# Patient Record
Sex: Male | Born: 1935 | Race: White | Hispanic: No | Smoking: Former smoker
Health system: Southern US, Community
[De-identification: ages and names within clinical notes are randomized; demographics above are authoritative.]

## PROBLEM LIST (undated history)

## (undated) DIAGNOSIS — C801 Malignant (primary) neoplasm, unspecified: Secondary | ICD-10-CM

## (undated) DIAGNOSIS — K219 Gastro-esophageal reflux disease without esophagitis: Secondary | ICD-10-CM

## (undated) DIAGNOSIS — I10 Essential (primary) hypertension: Secondary | ICD-10-CM

## (undated) DIAGNOSIS — I252 Old myocardial infarction: Secondary | ICD-10-CM

## (undated) DIAGNOSIS — E785 Hyperlipidemia, unspecified: Secondary | ICD-10-CM

## (undated) DIAGNOSIS — R7303 Prediabetes: Secondary | ICD-10-CM

## (undated) DIAGNOSIS — K633 Ulcer of intestine: Secondary | ICD-10-CM

## (undated) DIAGNOSIS — E559 Vitamin D deficiency, unspecified: Secondary | ICD-10-CM

## (undated) DIAGNOSIS — C61 Malignant neoplasm of prostate: Secondary | ICD-10-CM

## (undated) DIAGNOSIS — E079 Disorder of thyroid, unspecified: Secondary | ICD-10-CM

## (undated) HISTORY — DX: Malignant (primary) neoplasm, unspecified: C80.1

## (undated) HISTORY — DX: Gastro-esophageal reflux disease without esophagitis: K21.9

## (undated) HISTORY — DX: Essential (primary) hypertension: I10

## (undated) HISTORY — DX: Disorder of thyroid, unspecified: E07.9

## (undated) HISTORY — DX: Malignant neoplasm of prostate: C61

## (undated) HISTORY — PX: CATARACT EXTRACTION: SUR2

## (undated) HISTORY — DX: Vitamin D deficiency, unspecified: E55.9

## (undated) HISTORY — DX: Hyperlipidemia, unspecified: E78.5

## (undated) HISTORY — PX: PACEMAKER INSERTION: SHX728

## (undated) HISTORY — DX: Prediabetes: R73.03

## (undated) SURGERY — Surgical Case
Anesthesia: *Unknown

---

## 1997-07-31 ENCOUNTER — Ambulatory Visit (HOSPITAL_COMMUNITY): Admission: RE | Admit: 1997-07-31 | Discharge: 1997-07-31 | Payer: Self-pay | Admitting: Urology

## 1997-08-02 ENCOUNTER — Other Ambulatory Visit: Admission: RE | Admit: 1997-08-02 | Discharge: 1997-08-02 | Payer: Self-pay | Admitting: Internal Medicine

## 1998-11-21 ENCOUNTER — Emergency Department (HOSPITAL_COMMUNITY): Admission: EM | Admit: 1998-11-21 | Discharge: 1998-11-21 | Payer: Self-pay | Admitting: *Deleted

## 1999-01-21 ENCOUNTER — Encounter: Payer: Self-pay | Admitting: Internal Medicine

## 1999-01-21 ENCOUNTER — Ambulatory Visit (HOSPITAL_COMMUNITY): Admission: RE | Admit: 1999-01-21 | Discharge: 1999-01-21 | Payer: Self-pay | Admitting: Internal Medicine

## 2000-02-27 ENCOUNTER — Encounter: Payer: Self-pay | Admitting: Internal Medicine

## 2000-02-27 ENCOUNTER — Ambulatory Visit (HOSPITAL_COMMUNITY): Admission: RE | Admit: 2000-02-27 | Discharge: 2000-02-27 | Payer: Self-pay | Admitting: Internal Medicine

## 2001-03-04 ENCOUNTER — Ambulatory Visit (HOSPITAL_COMMUNITY): Admission: RE | Admit: 2001-03-04 | Discharge: 2001-03-04 | Payer: Self-pay | Admitting: Internal Medicine

## 2001-03-04 ENCOUNTER — Encounter: Payer: Self-pay | Admitting: Internal Medicine

## 2002-04-25 ENCOUNTER — Encounter: Payer: Self-pay | Admitting: Internal Medicine

## 2002-04-25 ENCOUNTER — Ambulatory Visit (HOSPITAL_COMMUNITY): Admission: RE | Admit: 2002-04-25 | Discharge: 2002-04-25 | Payer: Self-pay | Admitting: Internal Medicine

## 2003-05-21 ENCOUNTER — Inpatient Hospital Stay (HOSPITAL_COMMUNITY): Admission: EM | Admit: 2003-05-21 | Discharge: 2003-05-22 | Payer: Self-pay | Admitting: Emergency Medicine

## 2004-05-26 ENCOUNTER — Ambulatory Visit (HOSPITAL_COMMUNITY): Admission: RE | Admit: 2004-05-26 | Discharge: 2004-05-26 | Payer: Self-pay | Admitting: Internal Medicine

## 2005-04-01 ENCOUNTER — Ambulatory Visit: Payer: Self-pay | Admitting: Internal Medicine

## 2005-09-21 ENCOUNTER — Ambulatory Visit: Payer: Self-pay

## 2005-12-22 ENCOUNTER — Ambulatory Visit: Payer: Self-pay | Admitting: Internal Medicine

## 2006-06-22 ENCOUNTER — Ambulatory Visit (HOSPITAL_COMMUNITY): Admission: RE | Admit: 2006-06-22 | Discharge: 2006-06-22 | Payer: Self-pay | Admitting: Internal Medicine

## 2006-12-14 ENCOUNTER — Ambulatory Visit: Payer: Self-pay | Admitting: Internal Medicine

## 2007-06-13 ENCOUNTER — Ambulatory Visit: Payer: Self-pay | Admitting: Internal Medicine

## 2007-08-04 ENCOUNTER — Ambulatory Visit (HOSPITAL_COMMUNITY): Admission: RE | Admit: 2007-08-04 | Discharge: 2007-08-04 | Payer: Self-pay | Admitting: Internal Medicine

## 2007-08-17 ENCOUNTER — Ambulatory Visit: Payer: Self-pay | Admitting: Gastroenterology

## 2007-08-31 ENCOUNTER — Ambulatory Visit: Payer: Self-pay | Admitting: Gastroenterology

## 2007-08-31 ENCOUNTER — Encounter: Payer: Self-pay | Admitting: Gastroenterology

## 2007-09-02 ENCOUNTER — Encounter: Payer: Self-pay | Admitting: Gastroenterology

## 2007-12-27 ENCOUNTER — Ambulatory Visit: Payer: Self-pay | Admitting: Internal Medicine

## 2008-06-05 ENCOUNTER — Encounter: Payer: Self-pay | Admitting: Internal Medicine

## 2008-06-13 ENCOUNTER — Ambulatory Visit: Payer: Self-pay

## 2008-12-06 ENCOUNTER — Encounter: Payer: Self-pay | Admitting: Internal Medicine

## 2008-12-07 DIAGNOSIS — C61 Malignant neoplasm of prostate: Secondary | ICD-10-CM

## 2008-12-07 DIAGNOSIS — C329 Malignant neoplasm of larynx, unspecified: Secondary | ICD-10-CM

## 2008-12-07 DIAGNOSIS — I10 Essential (primary) hypertension: Secondary | ICD-10-CM | POA: Insufficient documentation

## 2008-12-07 DIAGNOSIS — E782 Mixed hyperlipidemia: Secondary | ICD-10-CM

## 2008-12-11 ENCOUNTER — Ambulatory Visit: Payer: Self-pay | Admitting: Internal Medicine

## 2008-12-11 DIAGNOSIS — Z950 Presence of cardiac pacemaker: Secondary | ICD-10-CM

## 2009-06-10 ENCOUNTER — Encounter: Payer: Self-pay | Admitting: Internal Medicine

## 2009-06-10 ENCOUNTER — Ambulatory Visit: Payer: Self-pay

## 2009-09-12 ENCOUNTER — Encounter (INDEPENDENT_AMBULATORY_CARE_PROVIDER_SITE_OTHER): Payer: Self-pay | Admitting: *Deleted

## 2009-10-17 ENCOUNTER — Ambulatory Visit: Payer: Self-pay | Admitting: Gastroenterology

## 2009-10-17 DIAGNOSIS — K219 Gastro-esophageal reflux disease without esophagitis: Secondary | ICD-10-CM

## 2009-12-11 ENCOUNTER — Ambulatory Visit: Payer: Self-pay | Admitting: Internal Medicine

## 2010-04-08 NOTE — Cardiovascular Report (Signed)
Summary: Office Visit   Office Visit   Imported By: Roderic Ovens 12/19/2009 15:19:01  _____________________________________________________________________  External Attachment:    Type:   Image     Comment:   External Document

## 2010-04-08 NOTE — Procedures (Signed)
Summary: [facility]   Current Medications (verified): 1)  Lansoprazole 30 Mg Cpdr (Lansoprazole) .... Take One Daily 2)  Synthroid 100 Mcg Tabs (Levothyroxine Sodium) .... Take One Daily 3)  Triamterene-Hctz 37.5-25 Mg Tabs (Triamterene-Hctz) .... Take One Half Daily 4)  Atenolol 50 Mg Tabs (Atenolol) .... Take One Daily 5)  Amitriptyline Hcl 50 Mg Tabs (Amitriptyline Hcl) .... Take One Daily 6)  Pravastatin Sodium 40 Mg Tabs (Pravastatin Sodium) .... Take One Daily 7)  Vit. D 8000 Units .... Daily 8)  Astelin 137 Mcg/spray Soln (Azelastine Hcl) .... Use As Needed  Allergies (verified): 1)  ! * Pcn  PPM Specifications Following MD:  Lewayne Bunting, MD     PPM Vendor:  St Jude     PPM Model Number:  [id number]     PPM Serial Number:  [id number] PPM DOI:  05/21/2003      Lead 1    Location: RA     DOI: 05/21/2003     Model #: 1488TC     Serial #: [id number]     Status: active Lead 2    Location: RV     DOI: 05/21/2003     Model #: 1488TC     Serial #: [id number]     Status: active  Magnet Response Rate:  BOL 98.6 ERI 86.3  Indications:  CHB Pacemaker dependent  Explantation Comments:  TTM's with Mednet  PPM Follow Up Battery Voltage:  2.76 V     Battery Est. Longevity:  3.75-4.50 yrs     Pacer Dependent:  Yes       PPM Device Measurements Atrium  Amplitude: 4.4 mV, Impedance: 488 ohms, Threshold: 0.75 V at 0.3 msec Right Ventricle  Amplitude: PACED mV, Impedance: 409 ohms, Threshold: 0.625 V at 0.5 msec  Episodes MS Episodes:  2     Percent Mode Switch:  <1%     Coumadin:  No Ventricular High Rate:  0     Atrial Pacing:  87%     Ventricular Pacing:  >99%  Parameters Mode:  DDDR     Lower Rate Limit:  70     Upper Rate Limit:  110 Paced AV Delay:  170     Sensed AV Delay:  150 Next Cardiology Appt Due:  06/09/2010 Tech Comments:  2 AMS EPISODES--LONGEST WAS 8 SECONDS.  NORMAL DEVICE FUNCTION.  NO CHANGES MADE. ROV IN 6 MTHS W/DEVICE CLINIC. Vella Kohler  December 11, 2009  6:07 PM

## 2010-04-08 NOTE — Assessment & Plan Note (Signed)
Summary: rectal bleeding--ch.   History of Present Illness Visit Type: Initial Consult Primary GI MD: Sheryn Bison MD FACP FAGA Primary Provider: Lucky Cowboy, MD Chief Complaint: Patient here for further evaluation of heme positive stool. He denies any visible rectal bleeding. He does have constipation. Patient denies any abdominal pain or other GI symptoms associated with the constipation. History of Present Illness:   77 year old Caucasian male with a history of chronic complete heart block requiring pacemaker insertion who has had previous radiation implants and external beam radiation for prostate cancer with resultant chronic radiation proctitis. He had colonoscopy 2 years ago and with removal of benign polyp and the detection of radiation proctitis.  He occasionally has some itching and discomfort around his rectum with a hard stool and apparently had a Hemoccult positive stool exam by his primary care physician. He denies abdominal pain, upper gastrointestinal or hepatobiliary complaints. He has a very involved past history and I spent a great deal of time reviewing his records and his multiple medical and surgical problems with the patient.  He takes daily Prevacid 30 mg for acid reflux. 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 His appetite is good and his weight is stable. He does have mild chronic functional constipation but denies laxative use. Recent lab data in July showed normal CBC with hemoglobin 15.4.   GI Review of Systems      Denies abdominal pain, acid reflux, belching, bloating, chest pain, dysphagia with liquids, dysphagia with solids, heartburn, loss of appetite, nausea, vomiting, vomiting blood, weight loss, and  weight gain.      Reports constipation and  heme positive stool.     Denies  anal fissure, black tarry stools, change in bowel habit, diarrhea, diverticulosis, fecal incontinence, hemorrhoids, irritable bowel syndrome, jaundice, light color stool, liver problems, rectal bleeding, and  rectal pain. Preventive Screening-Counseling & Management  Alcohol-Tobacco     Smoking Status: quit  Caffeine-Diet-Exercise     Does Patient Exercise: yes      Drug Use:  no.      Current Medications (verified): 1)  Lansoprazole 30 Mg Cpdr (Lansoprazole) .... Take One Daily 2)  Synthroid 100 Mcg Tabs (Levothyroxine Sodium) .... Take One Daily 3)  Triamterene-Hctz 37.5-25 Mg Tabs (Triamterene-Hctz) .... Take One Half Daily 4)  Atenolol 50 Mg Tabs (Atenolol) .... Take One Daily 5)  Amitriptyline Hcl 50 Mg Tabs (Amitriptyline Hcl) .... Take One Daily 6)  Pravastatin Sodium 40 Mg Tabs (Pravastatin Sodium) .... Take One Daily 7)  Vit. D 8000 Units .... Daily 8)  Astelin 137 Mcg/spray Soln (Azelastine Hcl) .... Use As Needed  Allergies (verified): 1)  ! * Pcn  Past History:  Past medical, surgical, family and social histories (including risk factors) reviewed for relevance to current acute and chronic problems.  Past Medical History:  PROSTATE CANCER (ICD-185) THROAT CANCER (ICD-149.0) HYPERTENSION (ICD-401.9) Seasonal Allergies Hypothyroidism Hyperlipidemia GERD  Past Surgical History: permanent pacemaker dual chamber [employer]. Jude 06/14/03 Cataract Extraction-bilateral Lens Implants-Bilateral  Family History: Reviewed history from 12/07/2008 and no changes required. Father: deeased at 33 yrs with leukemia Mother: heart disease at 28 yrs Siblings: 2 brothers 30 yrs died od lung cancer and one killed in WWII No FH of Colon Cancer:  Social History: Reviewed history from 12/07/2008 and no changes required. Married 3 x's Alcohol Use -  no Retired  Patient is a former smoker. -stopped in 1984  Daily Caffeine Use-1 cup coffee  Illicit Drug Use - no Patient gets regular  exercise. Smoking Status:  quit Drug Use:  no Does Patient Exercise:  yes  Review of Systems       The patient complains of allergy/sinus, change in vision, hearing problems, and urination - excessive.  The patient denies anemia, anxiety-new, arthritis/joint pain, back pain, blood in urine, breast changes/lumps, confusion, cough, coughing up blood, depression-new, fainting, fatigue, fever, headaches-new, heart murmur, heart rhythm changes, itching, menstrual pain, muscle pains/cramps, night sweats, nosebleeds, pregnancy symptoms, shortness of breath, skin rash, sleeping problems, sore throat, swelling of feet/legs, swollen lymph glands, thirst - excessive , urination - excessive , urination changes/pain, urine leakage, vision changes, and voice change.    Vital Signs:  Patient profile:   77 year old male Height:      72 inches Weight:      214.25 pounds BMI:     29.16 BSA:     2.20 Pulse rate:   76 / minute Pulse rhythm:   regular BP sitting:   128 / 80  (right arm)  Vitals Entered By: Lamona Curl CMA Duncan Dull) (October 17, 2009 9:46 AM)  Physical Exam  General:  Well developed, well nourished, no acute distress.healthy appearing.   Head:  Normocephalic and atraumatic. Eyes:  PERRLA, no icterus.exam deferred to patient's ophthalmologist.   Neck:  deformity of his neck area with scars in the right neck contraction of the skin and soft tissue. Lungs:  Clear throughout to auscultation.decreased BS on L and decreased BS on R.   Heart:  Regular rate and rhythm; no murmurs, rubs,  or bruits.Pacemaker in left upper chest area. Abdomen:  Soft, nontender and nondistended. No masses, hepatosplenomegaly or hernias noted. Normal bowel sounds. Rectal:  2 obvious nonbleeding next sternal hemorrhoids without fissures or fistulae. Rectal exam shows good sphincter tan without masses or tenderness. Stool is soft and normal color and guaiac-negative. Prostate:  .normal size prostate.   Msk:   Symmetrical with no gross deformities. Normal posture. Extremities:  No clubbing, cyanosis, edema or deformities noted. Neurologic:  Alert and  oriented x4;  grossly normal neurologically. Cervical Nodes:  No significant cervical adenopathy. Psych:  Alert and cooperative. Normal mood and affect.   Impression & Recommendations:  Problem # 1:  CONSTIPATION (ICD-564.00) Assessment Deteriorated High fiber diet with daily Benefiber as tolerated and also we will let him try [employer]' Colon Health 5 or and probiotic tablets twice a day. I've asked him to increase his intake of p.o. liquids additionally.  Problem # 2:  RECTAL BLEEDING (ICD-569.3) Assessment: Improved Probable combination of external hemorrhoids and radiation proctitis. Since he is asymptomatic and his hemoglobin is normal I see no need to repeat his colonoscopy at this time.  Problem # 3:  GERD (ICD-530.81) Assessment: Improved Continue Prevacid 30 mg a day with standard anti-reflux maneuvers. His previous laryngeal carcinoma may have been related to his chronic GERD which has been under good management and care for the last several years. I see no benefit from endoscopy this time because of his age and otherwise lack of upper gastrointestinal difficulties.  Problem # 4:  PERSONAL HISTORY MALIGNANT NEOPLASM PROSTATE (ICD-V10.46) Assessment: Unchanged Previous radiation treatment x2 for prostate carcinoma. He is followed by urology a regular basis.  Patient Instructions: 1)  Please pick up your medications at your pharmacy.  2)  Please pick up PHILLIPS COLON HEALTH, a  probiotic, while at your pharmacy.  You will need to take two times a day as directed below. 3)  Please schedule a follow-up appointment as needed.  4)  The medication list was reviewed and reconciled.  All changed / newly prescribed medications were explained.  A complete medication list was provided to the patient / caregiver. 5)  Copy sent to : Dr. Lucky Cowboy. 6)  Please continue current medications.  Prescriptions: ANAMANTLE HC 3-0.5 % CREA (LIDOCAINE-HYDROCORTISONE ACE) apply as directed to affected area  #30 gram x 3   Entered by:   Francee Piccolo CMA (AAMA)   Authorized by:   Mardella Layman MD [facility]   Signed by:   Francee Piccolo CMA (AAMA) on 10/17/2009   Method used:   Electronically to        [facility]  [address] [id number] (retail)       [address]       [place], [place]  [address]       Ph: [phone] or [phone]       Fax: [phone]   RxID:   [id number]

## 2010-04-08 NOTE — Procedures (Signed)
Summary: st. jude/saf   Current Medications (verified): 1)  Lansoprazole 30 Mg Cpdr (Lansoprazole) .... Take One Daily 2)  Synthroid 100 Mcg Tabs (Levothyroxine Sodium) .... Take One Daily 3)  Triamterene-Hctz 37.5-25 Mg Tabs (Triamterene-Hctz) .... Take One Half Daily 4)  Atenolol 50 Mg Tabs (Atenolol) .... Take One Daily 5)  Amitriptyline Hcl 50 Mg Tabs (Amitriptyline Hcl) .... Take One Daily 6)  Pravastatin Sodium 40 Mg Tabs (Pravastatin Sodium) .... Take One Daily 7)  Vit. D 8000 Units .... Daily 8)  Astelin 137 Mcg/spray Soln (Azelastine Hcl) .... Use As Needed  Allergies (verified): 1)  ! * Pcn   PPM Specifications Following MD:  Lewayne Bunting, MD     PPM Vendor:  St Jude     PPM Model Number:  [id number]     PPM Serial Number:  [id number] PPM DOI:  05/21/2003      Lead 1    Location: RA     DOI: 05/21/2003     Model #: 1488TC     Serial #: [id number]     Status: active Lead 2    Location: RV     DOI: 05/21/2003     Model #: 1488TC     Serial #: [id number]     Status: active  Magnet Response Rate:  BOL 98.6 ERI 86.3  Indications:  CHB Pacemaker dependent  Explantation Comments:  TTM's with Mednet  PPM Follow Up Remote Check?  No Battery Voltage:  2.76 V     Battery Est. Longevity:  4 years     Pacer Dependent:  Yes       PPM Device Measurements Atrium  Amplitude: 3.0 mV, Impedance: 521 ohms, Threshold: 1.0 V at 0.3 msec Right Ventricle  Impedance: 418 ohms, Threshold: 0.625 V at 0.5 msec  Episodes MS Episodes:  5     Percent Mode Switch:  <1%     Coumadin:  No Atrial Pacing:  88%     Ventricular Pacing:  69%  Parameters Mode:  DDDR     Lower Rate Limit:  70     Upper Rate Limit:  110 Paced AV Delay:  170     Sensed AV Delay:  150 Next Cardiology Appt Due:  12/07/2009 Tech Comments:  No parameter changes.  5 mode switch episodes noted the longest 5:52 hours.  Mr. Fitz is not on coumadin. He no longer does TTM's.  We will see him back in 6 months time with Dr. Ladona Ridgel.   Altha Harm, LPN  June 10, 6960 9:13 AM  MD Comments:  Agree with above.

## 2010-04-08 NOTE — Cardiovascular Report (Signed)
Summary: Office Visit   Office Visit   Imported By: Roderic Ovens 06/14/2009 13:57:33  _____________________________________________________________________  External Attachment:    Type:   Image     Comment:   External Document

## 2010-04-08 NOTE — Assessment & Plan Note (Signed)
Summary: pacer check/st. jude   Visit Type:  Follow-up/pacer check Referring Provider:  Lucky Cowboy, MD Primary Provider:  Lucky Cowboy, MD   History of Present Illness: Scott [name] returns today for followup of his PPM.  He is a pleasant elderly man with a h/o syncope and symptomatic bradycardia, s/p PPM, also with a h/o HTN and dyslipidemia.  He denies c/p or sob or peripheral edema.  He has had cataract surgery.  His vision is good.  No syncope or palpitations.   Current Medications (verified): 1)  Lansoprazole 30 Mg Cpdr (Lansoprazole) .... Take One Daily 2)  Synthroid 100 Mcg Tabs (Levothyroxine Sodium) .... Take One Daily 3)  Triamterene-Hctz 37.5-25 Mg Tabs (Triamterene-Hctz) .... Take One Half Daily 4)  Atenolol 50 Mg Tabs (Atenolol) .... Take One Daily 5)  Amitriptyline Hcl 50 Mg Tabs (Amitriptyline Hcl) .... Take One Daily 6)  Pravastatin Sodium 40 Mg Tabs (Pravastatin Sodium) .... Take One Daily 7)  Vit. D 8000 Units .... Daily 8)  Astelin 137 Mcg/spray Soln (Azelastine Hcl) .... Use As Needed  Allergies (verified): 1)  ! * Pcn  Past History:  Past Medical History: Last updated: 10/17/2009  PROSTATE CANCER (ICD-185) THROAT CANCER (ICD-149.0) HYPERTENSION (ICD-401.9) Seasonal Allergies Hypothyroidism Hyperlipidemia GERD  Past Surgical History: Last updated: 10/17/2009 permanent pacemaker dual chamber St. Jude 06/14/03 Cataract Extraction-bilateral Lens Implants-Bilateral  Review of Systems  The patient denies chest pain, syncope, dyspnea on exertion, and peripheral edema.    Vital Signs:  Patient profile:   77 year old male Height:      72 inches Weight:      214 pounds BMI:     29.13 Pulse rate:   74 / minute BP sitting:   117 / 73  (left arm) Cuff size:   regular  Vitals Entered By: Scott Kanaris, CNA (December 11, 2009 11:47 AM)  Physical Exam  General:  Well developed, well nourished, no acute distress.healthy appearing.   Head:   Normocephalic and atraumatic. Eyes:  PERRLA, no icterus.exam deferred to patient's ophthalmologist.   Mouth:  Poor dentition. Neck:  deformity of his neck area with scars in the right neck contraction of the skin and soft tissue. Chest Wall:  Well healed PPM incision. Lungs:  Clear throughout to auscultation.decreased BS on L and decreased BS on R.   Heart:  Regular rate and rhythm; no murmurs, rubs,  or bruits.Pacemaker in left upper chest area. Abdomen:  Soft, nontender and nondistended. No masses, hepatosplenomegaly or hernias noted. Normal bowel sounds. Msk:  Symmetrical with no gross deformities. Normal posture. Pulses:  pulses normal in all 4 extremities Extremities:  No clubbing, cyanosis, edema or deformities noted. Neurologic:  Alert and  oriented x4;  grossly normal neurologically.   PPM Specifications Following MD:  Lewayne Bunting, MD     PPM Vendor:  St Jude     PPM Model Number:  [id number]     PPM Serial Number:  [id number] PPM DOI:  05/21/2003      Lead 1    Location: RA     DOI: 05/21/2003     Model #: 1488TC     Serial #: [id number]     Status: active Lead 2    Location: RV     DOI: 05/21/2003     Model #: 1488TC     Serial #: [id number]     Status: active  Magnet Response Rate:  BOL 98.6 ERI 86.3  Indications:  CHB Pacemaker dependent  Explantation Comments:  TTM's with Mednet  PPM Follow Up Pacer Dependent:  Yes      Episodes Coumadin:  No  Parameters Mode:  DDDR     Lower Rate Limit:  70     Upper Rate Limit:  110 Paced AV Delay:  170     Sensed AV Delay:  150 MD Comments:  Normal device function.   Impression & Recommendations:  Problem # 1:  CARDIAC PACEMAKER IN SITU (ICD-V45.01) His device is working normally and he is maintaining NSR.  will recheck in several months.  Problem # 2:  HYPERTENSION (ICD-401.9) His blood pressure is well controlled.  Continue meds as below and maintian a low sodium diet. His updated medication list for this problem includes:     Triamterene-hctz 37.5-25 Mg Tabs (Triamterene-hctz) .Marland Kitchen... Take one half daily    Atenolol 50 Mg Tabs (Atenolol) .Marland Kitchen... Take one daily  Problem # 3:  HYPERLIPIDEMIA, MILD (ICD-272.4) A low fat diet and continued meds as below are recommended. His updated medication list for this problem includes:    Pravastatin Sodium 40 Mg Tabs (Pravastatin sodium) .Marland Kitchen... Take one daily  Patient Instructions: 1)  Your physician recommends that you continue on your current medications as directed. Please refer to the Current Medication list given to you today. 2)  Your physician wants you to follow-up in: 1 year  You will receive a reminder letter in the mail two months in advance. If you don't receive a letter, please call our office to schedule the follow-up appointment.

## 2010-04-08 NOTE — Letter (Signed)
Summary: New Patient letter  [facility]  [address] [place], [place] [address]   Phone: [phone]  Fax: [phone]       09/12/2009 MRN: [id number]  Scott [name] [address] [place], [place]  [address]  Dear Scott [name],  Welcome to the Gastroenterology Division at [facility].    You are scheduled to see Dr.  Jarold Motto on 10-17-09 at 10:00a.m. on the 3rd floor at [facility], [address]  We ask that you try to arrive at our office 15 minutes prior to your appointment time to allow for check-in.  We would like you to complete the enclosed self-administered evaluation form prior to your visit and bring it with you on the day of your appointment.  We will review it with you.  Also, please bring a complete list of all your medications or, if you prefer, bring the medication bottles and we will list them.  Please bring your insurance card so that we may make a copy of it.  If your insurance requires a referral to see a specialist, please bring your referral form from your primary care physician.  Co-payments are due at the time of your visit and may be paid by cash, check or credit card.     Your office visit will consist of a consult with your physician (includes a physical exam), any laboratory testing he/she may order, scheduling of any necessary diagnostic testing (e.g. x-ray, ultrasound, CT-scan), and scheduling of a procedure (e.g. Endoscopy, Colonoscopy) if required.  Please allow enough time on your schedule to allow for any/all of these possibilities.    If you cannot keep your appointment, please call [phone] to cancel or reschedule prior to your appointment date.  This allows [place] the opportunity to schedule an appointment for another patient in need of care.  If you do not cancel or reschedule by 5 p.m. the business day prior to your appointment date, you will be charged a $50.00 late cancellation/no-show fee.    Thank you for choosing  [facility] for your medical needs.  We appreciate the opportunity to care for you.  Please visit [place] at our website  to learn more about our practice.                     Sincerely,                                                             The Gastroenterology Division

## 2010-04-09 ENCOUNTER — Encounter: Payer: Self-pay | Admitting: Internal Medicine

## 2010-04-09 ENCOUNTER — Encounter (INDEPENDENT_AMBULATORY_CARE_PROVIDER_SITE_OTHER): Payer: Medicare Other | Admitting: Internal Medicine

## 2010-04-09 ENCOUNTER — Ambulatory Visit: Admit: 2010-04-09 | Payer: Self-pay | Admitting: Internal Medicine

## 2010-04-09 DIAGNOSIS — I10 Essential (primary) hypertension: Secondary | ICD-10-CM

## 2010-04-09 DIAGNOSIS — I442 Atrioventricular block, complete: Secondary | ICD-10-CM

## 2010-04-16 NOTE — Assessment & Plan Note (Signed)
Summary: F1Y/FM PER PT CALL-MJ/MJ   Vital Signs:  Patient profile:   77 year old male Height:      72 inches Weight:      220 pounds BMI:     29.95 Pulse rate:   72 / minute BP sitting:   130 / 80  (left arm)  Vitals Entered By: Laurance Flatten CMA (April 09, 2010 10:37 AM)  Referring Provider:  Lucky Cowboy, MD Primary Provider:  Lucky Cowboy, MD   History of Present Illness: Mr. Morini returns today for followup of his PPM.  He is a pleasant elderly man with a h/o syncope and symptomatic bradycardia, s/p PPM, also with a h/o HTN and dyslipidemia.  He denies c/p or sob or peripheral edema.  He has had cataract surgery.  His vision is good.  No syncope or palpitations. His only complaint today is that if he gets up quickly he will get dizzy. No frank synocpe.   Current Medications (verified): 1)  Lansoprazole 30 Mg Cpdr (Lansoprazole) .... Take One Daily 2)  Synthroid 100 Mcg Tabs (Levothyroxine Sodium) .... Take One Daily 3)  Triamterene-Hctz 37.5-25 Mg Tabs (Triamterene-Hctz) .... Take One Half Daily 4)  Atenolol 50 Mg Tabs (Atenolol) .... Take One Daily 5)  Amitriptyline Hcl 50 Mg Tabs (Amitriptyline Hcl) .... Take One Daily 6)  Pravastatin Sodium 40 Mg Tabs (Pravastatin Sodium) .... Take One Daily 7)  Vit. D 8000 Units .... Daily 8)  Astelin 137 Mcg/spray Soln (Azelastine Hcl) .... Use As Needed  Allergies: 1)  ! * Pcn  Past History:  Past Medical History: Last updated: 10/17/2009  PROSTATE CANCER (ICD-185) THROAT CANCER (ICD-149.0) HYPERTENSION (ICD-401.9) Seasonal Allergies Hypothyroidism Hyperlipidemia GERD  Past Surgical History: Last updated: 10/17/2009 permanent pacemaker dual chamber St. Jude 06/14/03 Cataract Extraction-bilateral Lens Implants-Bilateral  Family History: Last updated: 10/17/2009 Father: deeased at 41 yrs with leukemia Mother: heart disease at 38 yrs Siblings: 2 brothers 56 yrs died od lung cancer and one killed in WWII No FH  of Colon Cancer:  Social History: Last updated: 10/17/2009 Married 3 x's Alcohol Use - no Retired  Patient is a former smoker. -stopped in 1984  Daily Caffeine Use-1 cup coffee  Illicit Drug Use - no Patient gets regular exercise.  Review of Systems  The patient denies chest pain, syncope, dyspnea on exertion, and peripheral edema.    Physical Exam  General:  Well developed, well nourished, no acute distress.healthy appearing.   Head:  Normocephalic and atraumatic. Eyes:  PERRLA, no icterus.exam deferred to patient's ophthalmologist.   Mouth:  Poor dentition. Neck:  deformity of his neck area with scars in the right neck contraction of the skin and soft tissue. Chest Wall:  Well healed PPM incision. Lungs:  Clear throughout to auscultation.decreased BS on L and decreased BS on R.   Heart:  Regular rate and rhythm; no murmurs, rubs,  or bruits.Pacemaker in left upper chest area. Abdomen:  Soft, nontender and nondistended. No masses, hepatosplenomegaly or hernias noted. Normal bowel sounds. Msk:  Symmetrical with no gross deformities. Normal posture. Pulses:  pulses normal in all 4 extremities Extremities:  No clubbing, cyanosis, edema or deformities noted. Neurologic:  Alert and  oriented x4;  grossly normal neurologically.   Impression & Recommendations:  Problem # 1:  CARDIAC PACEMAKER IN SITU (ICD-V45.01) His device is working normally. Will recheck in several months.  Problem # 2:  HYPERTENSION (ICD-401.9) His blood pressure is well controlled. A low sodium diet is requested. His  updated medication list for this problem includes:    Triamterene-hctz 37.5-25 Mg Tabs (Triamterene-hctz) .Marland Kitchen... Take one half daily    Atenolol 50 Mg Tabs (Atenolol) .Marland Kitchen... Take one daily  Patient Instructions: 1)  Your physician recommends that you schedule a follow-up appointment in: 6 months with device clinic and 12 months with Dr Ladona Ridgel      [facility] Specifications Following MD:  Lewayne Bunting, MD     PPM Vendor:  [facility] Jude     PPM Model Number:  [id number]     PPM Serial Number:  [id number] PPM DOI:  05/21/2003      Lead 1    Location: RA     DOI: 05/21/2003     Model #: [id number]     Serial #: [id number]     Status: active Lead 2    Location: RV     DOI: 05/21/2003     Model #: [id number]     Serial #: [id number]     Status: active  Magnet Response Rate:  BOL 98.6 ERI 86.3  Indications:  CHB Pacemaker dependent  Explantation Comments:  TTM's with [employer]  PPM Follow Up Pacer Dependent:  Yes       PPM Device Measurements Atrium  Amplitude: 3.5 mV, Impedance: 461 ohms, Threshold: 0.75 V at 0.3 msec Right Ventricle  Amplitude: PACED mV, Impedance: 422 ohms,   Episodes MS Episodes:  5     Percent Mode Switch:  <1%     Coumadin:  No Ventricular High Rate:  0     Atrial Pacing:  91%     Ventricular Pacing:  >99%  Parameters Mode:  DDDR     Lower Rate Limit:  70     Upper Rate Limit:  110 Paced AV Delay:  170     Sensed AV Delay:  150 Next Cardiology Appt Due:  10/08/2010 Tech Comments:  5 AMS EPISODES--LONGEST WAS 8 SECONDS.  NORMAL DEVICE FUNCTION. HISTOGRAMS A LITTLE BUT PT HAS NO SOB OR FATIGUE.  NO CHANGES MADE. ROV IN 6 MTHS W/DEVICE CLINIC. Vella Kohler  April 09, 2010 10:52 AM MD Comments:  Agree with above.

## 2010-04-24 NOTE — Cardiovascular Report (Signed)
Summary: Office Visit   Office Visit   Imported By: Roderic Ovens 04/17/2010 16:19:34  _____________________________________________________________________  External Attachment:    Type:   Image     Comment:   External Document

## 2010-07-22 NOTE — Assessment & Plan Note (Signed)
[facility]                         ELECTROPHYSIOLOGY OFFICE NOTE   Scott [name], Scott [name]                     MRN:          [id number]  DATE:12/27/2007                            DOB:          [birth year]    Scott [name] returns today for followup.  He is a very pleasant elderly  male with a history of complete heart block and bradycardia.  He also  has a history of hypertension, who returns today for followup.  The  patient has done well since we last saw him one year ago.  He had no  specific complaints today.   MEDICATIONS INCLUDE:  1. Amitriptyline 50 mg q.p.m.  2. Maxzide 37.5/25 daily.  3. Synthroid 100 mcg daily.  4. Prevacid 30 mg daily.  5. Atenolol 50 mg half-tablet daily.  6. Pravachol 40 mg daily.   PHYSICAL EXAMINATION:  GENERAL:  He is a pleasant well-appearing elderly  man in no acute distress.  VITAL SIGNS:  Blood pressure was 130/73, the pulse 70 and regular, the  respirations were 18, and the weight was 215 pounds.  NECK:  Reveals no jugular venous distention.  LUNGS:  Clear bilaterally to auscultation.  No wheezes, rales, or  rhonchi are present.  No increased work of breathing.  CARDIOVASCULAR:  Reveals a regular rate and rhythm.  Normal S1 and S2.  There are no murmurs, rubs, or gallops.  EXTREMITIES:  Demonstrated no  peripheral edema.  ABDOMEN:  Soft and nontender.  There is no organomegaly.   Interrogation of his device demonstrates a Corporate treasurer.  P-waves  were 4.  The R-waves were 10.  The impedance was 454 in the A and 409 in  the RV.  The threshold was 1 at 0.5 in the atrium and 0.625 at 0.5 in  the RV.  Battery voltage was 2.76 volts.  He was 87% A-paced and 84% V-  paced.   IMPRESSION:  1. Complete heart block.  2. Hypertension.  3. Status post pacemaker insertion.   DISCUSSION:  Scott [name] is stable.  His pacemaker is working normally.  I plan to see the patient back in the office for pacemaker  followup in 1  year.    Scott [name]. Scott Ridgel, MD  Electronically Signed   GWT/MedQ  DD: 12/27/2007  DT: 12/28/2007  Job #: [id number]

## 2010-07-22 NOTE — Assessment & Plan Note (Signed)
[facility]                         ELECTROPHYSIOLOGY OFFICE NOTE   Scott [name]                     MRN:          [id number]  DATE:12/14/2006                            DOB:          1925/05/18    Scott [name] returns today for followup.  He is a very pleasant elderly  male with complete heart block and hypertension who is status post  pacemaker implantation back in 2005.  He returns today for followup.  He  notes that in the interim he has been very active and exercises on a  regular basis.  He denies chest pain, he denies shortness of breath.   PHYSICAL EXAMINATION:  He is a pleasant, well-appearing, elderly man in  no distress.  The blood pressure was 140/75, the pulse was 70 and  regular, the respirations were 16, the weight was 215 pounds.  NECK:  No jugular venous distention.  LUNGS:  Clear bilaterally to auscultation.  No wheezes, rales or rhonchi  were present.  CARDIOVASCULAR EXAM:  A regular rate and rhythm with a normal S1 S2.  EXTREMITIES:  No edema.   MEDICATIONS:  1. Amitriptyline.  2. Maxzide 37.5/25.  3. Synthroid 100 mcg daily.  4. Prevacid 30 a day.  5. Atenolol 50 mg 1/2 tablet daily.  6. Lipitor 4 mg, 1/2 tablet daily.   Interrogation of his pacemaker demonstrates a St. Jude Identity dual  chamber device with P waves that were 3 and R waves that were 8, the  impedance 456 in the atrium, 413 in the ventricle, the threshold 0.75 at  0.5 in the atrium, 0.625 at 0.5 in the ventricle, the battery voltage  was 2.76 volts.   IMPRESSION:  1. Complete heart block.  2. Hypertension.  3. Status post pacemaker insertion.   DISCUSSION:  Overall, Scott [name] is stable and his pacemaker was  working normally.  Will plan to see him back in the office in 1 year.     Doylene Canning. Ladona Ridgel, MD  Electronically Signed    GWT/MedQ  DD: 12/14/2006  DT: 12/14/2006  Job #: [id number]

## 2010-07-25 NOTE — Assessment & Plan Note (Signed)
[facility]                           ELECTROPHYSIOLOGY OFFICE NOTE   [name], NO                     MRN:          [id number]  DATE:12/22/2005                            DOB:          05-02-1925    Scott [name] returns today for followup.  He is a very pleasant elderly male  with hypertension and complete heart block, syncope, who is status post  pacemaker insertion.  He returns today for followup.  He denies chest pain  or shortness of breath and overall feels well, having no syncopal episodes  since his pacemaker was implanted.  This was carried out on March, 2005.   PHYSICAL EXAMINATION:  GENERAL:  On exam today, he is a pleasant, well-  appearing man in no acute distress.  VITAL SIGNS:  His blood pressure today was 126/76, pulse 71 and regular,  respirations were 18.  Weight was 225 pounds.  NECK:  No jugular venous distention.  LUNGS:  Clear bilaterally to auscultation.  CARDIOVASCULAR:  Regular rate and rhythm with normal S1 and S2.  EXTREMITIES:  No edema.   Interrogation of his pacemaker demonstrates a St. Jude Identify with P waves  of 3 and R waves which are not present secondary to his underlying pacemaker  dependence.  The impedence was 462 on the atrium and 430 in the ventricle.  The threshold was 0.75 to 0.5 in the atrium, 0.625 at 0.5 in the ventricle.  Battery voltage was 2.75 volts.   IMPRESSION:  1. Complete heart block.  2. Hypertension.  3. Status post pacemaker insertion.   DISCUSSION:  Overall, Scott [name] is stable, and his pacemaker is working  satisfactorily.  Will plan to see him back in the office in a year.            ______________________________  Doylene Canning. Ladona Ridgel, MD     GWT/MedQ  DD:  12/22/2005  DT:  12/23/2005  Job #:  [id number]   cc:   Lucky Cowboy, M.D.

## 2010-07-25 NOTE — Op Note (Signed)
NAME:  Scott [name], Scott [name]                        ACCOUNT NO.:  [id number]   MEDICAL RECORD NO.:  [id number]                   PATIENT TYPE:  INP   LOCATION:  1824                                 FACILITY:  [facility]   PHYSICIAN:  Doylene Canning. Ladona Ridgel, M.D.               DATE OF BIRTH:  01/06/1926   DATE OF PROCEDURE:  05/21/2003  DATE OF DISCHARGE:                                 OPERATIVE REPORT   PROCEDURE PERFORMED:  Insertion of a permanent dual-chamber pacemaker.   INDICATIONS:  Symptomatic complete heart block with syncope.   INTRODUCTION:  The patient is a very pleasant elderly man with a history of  multiple medical problems, including head and neck cancer status post  dissection.  He has a history of left lung abscess treated by Dr. Micah Noel  in the past.  He has a history of prostate cancer.  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  The patient  was in his usual health until approximately three days ago, when he had  episodes of dizziness as well as frank syncope.  This would typically occur  while standing.  He proceeded to his primary physician's office Lucky Cowboy, M.D.) and was found to be in complete heart block with a  ventricular rate between 26 and 30 beats per minute.  At rest the patient  was asymptomatic but with any exertion, he became quite symptomatic and had  syncope.  He is now admitted for permanent pacemaker insertion.  The patient  denies chest pain or shortness of breath.   DESCRIPTION OF PROCEDURE:  After informed consent was obtained, the patient  was taken to the diagnostic EP lab in the fasted state.  After the usual  preparation and draping, a total of 30 mL of lidocaine was infiltrated into  the left infraclavicular region.  A 7 cm incision was carried out over this  region and electrocautery utilized to dissect down to the fascial plane.  The left subclavian vein was punctured x2 and the St. Jude bipolar  pacing  lead, serial number [id number], serial number [id number], bipolar pacing lead  was advanced into the right ventricle and the St. Jude model 1488-T 52-cm  active-fixation pacing lead, serial number [id number], was advanced to the  right atrium.  Mapping was carried out in the right ventricle.  During  mapping there were multiple PVCs, which was followed by a period of asystole  lasting approximately 10 seconds.  This occurred on one additional occasion.  Finally the RV lead was placed out near the RV apex on the RV septum.  It  should be noted that the lead was actively fixed and there were stable R-  waves and pacing thresholds.  The patient had significant tricuspid  regurgitation when he was in complete heart block.  At the final site the R-  waves were stable at 6 V and the  pacing threshold was 0.5 V at 0.5 msec with  a pacing impedance of 625 Ohms, which was also quite stable.  Ten-volt  pacing in this location did not stimulate the diaphragm.  With the  ventricular lead in satisfactory position, attention was then turned to the  atrial lead.  It was placed on the anterolateral wall of the right atrium,  where P-waves measured 3-4 mV and the atrial threshold was 0.9 V at 0.5 msec  with a pacing impedance of 581 Ohms, again once the lead was actively fixed.  Ten-volt pacing did not stimulate the diaphragm.  With both atrial and  ventricular leads in satisfactory position, they were secured to the  subpectoralis fascia with a figure-of-eight silk suture.  The sewing sleeve  was also secured with a silk suture.  At this point electrocautery was  utilized to make a subcutaneous pocket.  Kanamycin was utilized to irrigate  the pocket.  Electrocautery was utilized to assure hemostasis.  The St. Jude  model [id number] dual-chamber pacemaker, serial number [id number], was connected to  the atrial and ventricular pacing leads and placed back in the subcutaneous  pocket.  The generator was secured with  silk suture.  Additional kanamycin  was utilized to irrigate the pocket and the incision was closed with a layer  of 2-0 Vicryl, followed by a layer of 3-0 Vicryl, followed by a layer of 4-0  Vicryl.  Benzoin was painted on the skin and Steri-Strips were applied, and  a pressure dressing placed and the patient returned to his room in  satisfactory condition.   COMPLICATIONS:  There were no immediate procedural complications.   RESULTS:  This demonstrates successful implantation of a St. Jude dual-  chamber pacemaker in a patient with symptomatic completely heart block and a  ventricular escape in the 25-30 beat per minute range.                                               Doylene Canning. Ladona Ridgel, M.D.    GWT/MEDQ  D:  05/21/2003  T:  05/22/2003  Job:  [id number]   cc:   Lucky Cowboy, M.D.  [address]  [place], [place] [address]  Fax: [phone]   Kathrine Cords, R.N. [facility]

## 2010-07-25 NOTE — Discharge Summary (Signed)
NAME:  SYDNEY, AZURE                        ACCOUNT NO.:  [id number]   MEDICAL RECORD NO.:  [id number]                   PATIENT TYPE:  INP   LOCATION:  2927                                 FACILITY:  [facility]   PHYSICIAN:  Doylene Canning. Ladona Ridgel, M.D.               DATE OF BIRTH:  [birth year]   DATE OF ADMISSION:  05/21/2003  DATE OF DISCHARGE:  05/22/2003                                 DISCHARGE SUMMARY   PRIMARY DIAGNOSIS:  Syncope.   SECONDARY DIAGNOSES:  1. Hypertension.  2. Throat cancer.  3. Prostate cancer.   HPI:  This is a 77 year old gentleman with a past medical history of  hypertension, dyslipidemia and throat cancer, prostate cancer who on routine  exam was found to have a complete heart block.  He states for the past week  or so when he gets up fast he has loss of consciousness.  EKG shows a  complete heart block.  The patient was admitted and underwent placement of a  permanent pacemaker dual chamber St. Jude, tolerated the procedure well.  Had no immediate postop complications.  He was discharged the following day  in stable condition.   The patient was discharged on the following medications:  1. Synthroid 100 mcg daily.  2. Maxzide 37.5/25 daily.  3. Elavil 50 nightly.  4. Prevacid 30 daily.  5. Atenolol 25 daily.  6. Lipitor 40 every other day.  7. He is to take Tylenol one to two tabs every four to six hours as needed.   Activity and wound care were per pacemaker discharge sheet.  Low fat, low  salt, low cholesterol diet.  No driving for 10 days. He is to be seen at the  pacemaker clinic at [facility] March 30 at 9:45 and Dr. Ladona Ridgel June 21 at 10:30  a.m.  The patient has been instructed not to drive for 10 days.  He stated  that he must drive.  He was again spoken to by Dr. Ladona Ridgel about no driving.      Chinita Pester, C.R.N.P. [facility]                 Doylene Canning. Ladona Ridgel, M.D.    DS/MEDQ  D:  06/14/2003  T:  06/14/2003  Job:  [id number]   cc:   PACEMAKER CLINIC   Lucky Cowboy, M.D.  [address], [address]  [place], [place] [address]  Fax: [phone]

## 2010-07-25 NOTE — Assessment & Plan Note (Signed)
[facility]                           ELECTROPHYSIOLOGY OFFICE NOTE   ANIL, HAVARD                     MRN:          [id number]  DATE:09/21/2005                            DOB:          [birth year]    Mr. Nydam is seen in device clinic today, September 21, 2005 for routine follow-  up of his St. Jude IDENTITY model [id number] dual chamber pacemaker implanted  March of 2005 by Dr. Ladona Ridgel.  Upon interrogation battery voltage is 2.76  volts.  In the atrium intrinsic amplitude is 5 millivolts with an impedance  of 4.51 ohms and threshold of 0.5 volts and 0.4 milliseconds.  In the right  ventricle he is dependent to a rate of 30.  The impedance is 421 ohms with  threshold of 0.625 volts at 0.4 milliseconds with ventricular auto capture  on.  No programming changes were made at this time.  Mr. Baquero will be  seen in six months' time for a followup and begin transtelephonic monitoring  at that point.                                   Cleatrice Burke, RN                                Doylene Canning. Ladona Ridgel, MD   CF/MedQ  DD:  09/21/2005  DT:  09/21/2005  Job #:  [id number]

## 2010-09-11 ENCOUNTER — Ambulatory Visit (INDEPENDENT_AMBULATORY_CARE_PROVIDER_SITE_OTHER): Payer: Medicare Other | Admitting: *Deleted

## 2010-09-11 DIAGNOSIS — I459 Conduction disorder, unspecified: Secondary | ICD-10-CM

## 2010-09-11 LAB — PACEMAKER DEVICE OBSERVATION
AL AMPLITUDE: 3.4 mv
AL IMPEDENCE PM: 446 Ohm
BAMS-0001: 170 {beats}/min
RV LEAD THRESHOLD: 0.75 V

## 2010-12-11 ENCOUNTER — Encounter: Payer: Self-pay | Admitting: Internal Medicine

## 2010-12-12 ENCOUNTER — Encounter: Payer: Self-pay | Admitting: Internal Medicine

## 2010-12-12 ENCOUNTER — Ambulatory Visit (INDEPENDENT_AMBULATORY_CARE_PROVIDER_SITE_OTHER): Payer: Medicare Other | Admitting: Internal Medicine

## 2010-12-12 DIAGNOSIS — I10 Essential (primary) hypertension: Secondary | ICD-10-CM

## 2010-12-12 DIAGNOSIS — Z950 Presence of cardiac pacemaker: Secondary | ICD-10-CM

## 2010-12-12 DIAGNOSIS — I442 Atrioventricular block, complete: Secondary | ICD-10-CM

## 2010-12-12 LAB — PACEMAKER DEVICE OBSERVATION
AL THRESHOLD: 0.75 V
BATTERY VOLTAGE: 2.75 V
VENTRICULAR PACING PM: 100

## 2010-12-12 NOTE — Progress Notes (Signed)
HPI [name] returns today for followup. He is a pleasant elderly man with a history of symptomatic bradycardia, hypertension, status post permanent pacemaker insertion. He denies chest pain, shortness of breath, or peripheral edema. No syncope. He notes that he is just getting over a cold. No fevers or chills currently. Allergies  Allergen Reactions  . Penicillins      Current Outpatient Prescriptions  Medication Sig Dispense Refill  . amitriptyline (ELAVIL) 50 MG tablet Take 50 mg by mouth at bedtime.        Marland Kitchen atenolol (TENORMIN) 50 MG tablet Take 50 mg by mouth daily.        Marland Kitchen azelastine (ASTELIN) 137 MCG/SPRAY nasal spray Place 1 spray into the nose 2 (two) times daily. Use in each nostril as directed       . Cholecalciferol (VITAMIN D-3 PO) Take 4,000 Units by mouth.        . lansoprazole (PREVACID) 30 MG capsule Take 30 mg by mouth daily.       Marland Kitchen levothyroxine (SYNTHROID, LEVOTHROID) 100 MCG tablet Take 100 mcg by mouth daily.        . pravastatin (PRAVACHOL) 40 MG tablet Take 40 mg by mouth daily.        Marland Kitchen triamterene-hydrochlorothiazide (DYAZIDE) 37.5-25 MG per capsule Take 1 capsule by mouth every morning.           Past Medical History  Diagnosis Date  . Prostate cancer   . Hypertension   . Cancer   . Thyroid disease     hypothyroidism  . Hyperlipidemia   . GERD (gastroesophageal reflux disease)     ROS:   All systems reviewed and negative except as noted in the HPI.   Past Surgical History  Procedure Date  . Pacemaker insertion     dul chamber St Jude 06/14/03  . Cataract extraction      Family History  Problem Relation Age of Onset  . Heart disease Mother 51     History   Social History  . Marital Status: Divorced    Spouse Name: N/A    Number of Children: N/A  . Years of Education: N/A   Occupational History  . Not on file.   Social History Main Topics  . Smoking status: Former Games developer  . Smokeless tobacco: Not on file  . Alcohol Use: Not  on file  . Drug Use: Not on file  . Sexually Active: Not on file   Other Topics Concern  . Not on file   Social History Narrative  . No narrative on file     BP 149/79  Pulse 78  Ht 6' (1.829 m)  Wt 205 lb 12.8 oz (93.35 kg)  BMI 27.91 kg/m2  Physical Exam:  Well appearing elderly man, NAD HEENT: Unremarkable Neck:  No JVD, no thyromegally Lymphatics:  No adenopathy Back:  No CVA tenderness Lungs:  Clear no wheezes, rales, or rhonchi. Well-healed pacemaker incision. HEART:  Regular rate rhythm, no murmurs, no rubs, no clicks Abd:  soft, positive bowel sounds, no organomegally, no rebound, no guarding Ext:  2 plus pulses, no edema, no cyanosis, no clubbing Skin:  No rashes no nodules Neuro:  CN II through XII intact, motor grossly intact  DEVICE  Normal device function.  See PaceArt for details.   Assess/Plan:

## 2010-12-12 NOTE — Patient Instructions (Signed)
Your physician wants you to follow-up in: 6 MONTHS WITH DR. TAYLOR. You will receive a reminder letter in the mail two months in advance. If you don't receive a letter, please call our office to schedule the follow-up appointment.  Your physician recommends that you continue on your current medications as directed. Please refer to the Current Medication list given to you today.  

## 2010-12-12 NOTE — Assessment & Plan Note (Signed)
His device is working normally. We'll plan a recheck in several months. 

## 2010-12-12 NOTE — Assessment & Plan Note (Signed)
His blood pressure is a bit elevated today. I discussed the importance of maintaining a low-sodium diet. He will continue his current active lifestyle. We will continue his current medical therapy

## 2011-06-10 ENCOUNTER — Encounter: Payer: Self-pay | Admitting: Internal Medicine

## 2011-06-10 ENCOUNTER — Ambulatory Visit (INDEPENDENT_AMBULATORY_CARE_PROVIDER_SITE_OTHER): Payer: Medicare Other | Admitting: *Deleted

## 2011-06-10 DIAGNOSIS — I459 Conduction disorder, unspecified: Secondary | ICD-10-CM

## 2011-06-10 DIAGNOSIS — Z950 Presence of cardiac pacemaker: Secondary | ICD-10-CM

## 2011-06-10 LAB — PACEMAKER DEVICE OBSERVATION
AL AMPLITUDE: 3.6 mv
AL IMPEDENCE PM: 438 Ohm
ATRIAL PACING PM: 87
BAMS-0001: 170 {beats}/min
BATTERY VOLTAGE: 2.75 V

## 2011-06-10 NOTE — Progress Notes (Signed)
Pacer check in clinic  

## 2011-11-25 ENCOUNTER — Encounter: Payer: Self-pay | Admitting: *Deleted

## 2011-12-09 ENCOUNTER — Encounter: Payer: Self-pay | Admitting: Internal Medicine

## 2011-12-09 ENCOUNTER — Ambulatory Visit (INDEPENDENT_AMBULATORY_CARE_PROVIDER_SITE_OTHER): Payer: Medicare Other | Admitting: Internal Medicine

## 2011-12-09 VITALS — BP 132/70 | HR 80 | Resp 18 | Ht 72.0 in | Wt 204.1 lb

## 2011-12-09 DIAGNOSIS — I10 Essential (primary) hypertension: Secondary | ICD-10-CM

## 2011-12-09 DIAGNOSIS — Z950 Presence of cardiac pacemaker: Secondary | ICD-10-CM

## 2011-12-09 DIAGNOSIS — I442 Atrioventricular block, complete: Secondary | ICD-10-CM

## 2011-12-09 LAB — PACEMAKER DEVICE OBSERVATION
AL THRESHOLD: 0.75 V
ATRIAL PACING PM: 89
BATTERY VOLTAGE: 2.74 V
VENTRICULAR PACING PM: 99

## 2011-12-09 NOTE — Assessment & Plan Note (Signed)
His blood pressure is well controlled. We'll continue his current medical therapy. He is instructed to maintain a low-sodium diet.

## 2011-12-09 NOTE — Progress Notes (Signed)
HPI [name] returns today for followup. He is a very pleasant elderly man with complete heart block, status post permanent pacemaker insertion. He also has hypertension and dyslipidemia. In the interim, he has done well. He remains active, working in his yard. He denies chest pain or shortness of breath. Allergies  Allergen Reactions  . Penicillins      Current Outpatient Prescriptions  Medication Sig Dispense Refill  . amitriptyline (ELAVIL) 50 MG tablet Take 50 mg by mouth at bedtime.        Marland Kitchen atenolol (TENORMIN) 50 MG tablet Take 50 mg by mouth daily.        Marland Kitchen azelastine (ASTELIN) 137 MCG/SPRAY nasal spray Place 1 spray into the nose 2 (two) times daily. Use in each nostril as directed       . Cholecalciferol (VITAMIN D-3 PO) Take 4,000 Units by mouth.        . lansoprazole (PREVACID) 30 MG capsule Take 30 mg by mouth daily.       Marland Kitchen levothyroxine (SYNTHROID, LEVOTHROID) 100 MCG tablet Take 100 mcg by mouth daily.        . pravastatin (PRAVACHOL) 40 MG tablet Take 40 mg by mouth daily.        Marland Kitchen triamterene-hydrochlorothiazide (DYAZIDE) 37.5-25 MG per capsule Take 1 capsule by mouth every morning.           Past Medical History  Diagnosis Date  . Prostate cancer   . Hypertension   . Cancer   . Thyroid disease     hypothyroidism  . Hyperlipidemia   . GERD (gastroesophageal reflux disease)     ROS:   All systems reviewed and negative except as noted in the HPI.   Past Surgical History  Procedure Date  . Pacemaker insertion     dul chamber St Jude 06/14/03  . Cataract extraction      Family History  Problem Relation Age of Onset  . Heart disease Mother 20     History   Social History  . Marital Status: Divorced    Spouse Name: N/A    Number of Children: N/A  . Years of Education: N/A   Occupational History  . Not on file.   Social History Main Topics  . Smoking status: Former Games developer  . Smokeless tobacco: Not on file  . Alcohol Use: Not on file  . Drug  Use: Not on file  . Sexually Active: Not on file   Other Topics Concern  . Not on file   Social History Narrative  . No narrative on file     BP 132/70  Pulse 80  Resp 18  Ht 6' (1.829 m)  Wt 204 lb 1.9 oz (92.588 kg)  BMI 27.68 kg/m2  SpO2 91%  Physical Exam:  Well appearing elderly man, NAD HEENT: Unremarkable Neck:  No JVD, no thyromegally Lungs:  Clear with no wheezes, rales, or rhonchi. HEART:  Regular rate rhythm, no murmurs, no rubs, no clicks Abd:  soft, positive bowel sounds, no organomegally, no rebound, no guarding Ext:  2 plus pulses, no edema, no cyanosis, no clubbing Skin:  No rashes no nodules Neuro:  CN II through XII intact, motor grossly intact  DEVICE  Normal device function.  See PaceArt for details.   Assess/Plan:

## 2011-12-09 NOTE — Assessment & Plan Note (Signed)
His dual-chamber St. Jude pacemaker is working normally. He has approximately 2 and half years of battery longevity. We'll plan to recheck in several months.

## 2011-12-09 NOTE — Patient Instructions (Addendum)
Your physician wants you to follow-up in: 12 months with Dr Court Joy will receive a reminder letter in the mail two months in advance. If you don't receive a letter, please call our office to schedule the follow-up appointment.  Your physician wants you to follow-up in: 6 months with the Device clinic. You will receive a reminder letter in the mail two months in advance. If you don't receive a letter, please call our office to schedule the follow-up appointment.

## 2011-12-23 ENCOUNTER — Encounter: Payer: Self-pay | Admitting: Internal Medicine

## 2012-04-03 ENCOUNTER — Emergency Department (HOSPITAL_COMMUNITY): Payer: Medicare Other

## 2012-04-03 ENCOUNTER — Inpatient Hospital Stay (HOSPITAL_COMMUNITY)
Admission: EM | Admit: 2012-04-03 | Discharge: 2012-04-09 | DRG: 872 | Disposition: A | Discharge status: Home Health | Payer: Medicare Other | Attending: Internal Medicine | Admitting: Internal Medicine

## 2012-04-03 ENCOUNTER — Encounter (HOSPITAL_COMMUNITY): Payer: Self-pay | Admitting: Physical Medicine and Rehabilitation

## 2012-04-03 DIAGNOSIS — K219 Gastro-esophageal reflux disease without esophagitis: Secondary | ICD-10-CM | POA: Diagnosis present

## 2012-04-03 DIAGNOSIS — Z8546 Personal history of malignant neoplasm of prostate: Secondary | ICD-10-CM

## 2012-04-03 DIAGNOSIS — R339 Retention of urine, unspecified: Secondary | ICD-10-CM | POA: Diagnosis present

## 2012-04-03 DIAGNOSIS — I442 Atrioventricular block, complete: Secondary | ICD-10-CM

## 2012-04-03 DIAGNOSIS — C61 Malignant neoplasm of prostate: Secondary | ICD-10-CM

## 2012-04-03 DIAGNOSIS — I10 Essential (primary) hypertension: Secondary | ICD-10-CM | POA: Diagnosis present

## 2012-04-03 DIAGNOSIS — K5900 Constipation, unspecified: Secondary | ICD-10-CM

## 2012-04-03 DIAGNOSIS — Z950 Presence of cardiac pacemaker: Secondary | ICD-10-CM

## 2012-04-03 DIAGNOSIS — K625 Hemorrhage of anus and rectum: Secondary | ICD-10-CM

## 2012-04-03 DIAGNOSIS — C140 Malignant neoplasm of pharynx, unspecified: Secondary | ICD-10-CM

## 2012-04-03 DIAGNOSIS — E785 Hyperlipidemia, unspecified: Secondary | ICD-10-CM | POA: Diagnosis present

## 2012-04-03 DIAGNOSIS — N19 Unspecified kidney failure: Secondary | ICD-10-CM

## 2012-04-03 DIAGNOSIS — N12 Tubulo-interstitial nephritis, not specified as acute or chronic: Secondary | ICD-10-CM | POA: Diagnosis present

## 2012-04-03 DIAGNOSIS — Z79899 Other long term (current) drug therapy: Secondary | ICD-10-CM

## 2012-04-03 DIAGNOSIS — R5381 Other malaise: Secondary | ICD-10-CM | POA: Diagnosis not present

## 2012-04-03 DIAGNOSIS — D72829 Elevated white blood cell count, unspecified: Secondary | ICD-10-CM

## 2012-04-03 DIAGNOSIS — N390 Urinary tract infection, site not specified: Secondary | ICD-10-CM

## 2012-04-03 DIAGNOSIS — R911 Solitary pulmonary nodule: Secondary | ICD-10-CM | POA: Diagnosis present

## 2012-04-03 DIAGNOSIS — C329 Malignant neoplasm of larynx, unspecified: Secondary | ICD-10-CM | POA: Diagnosis present

## 2012-04-03 DIAGNOSIS — A419 Sepsis, unspecified organism: Secondary | ICD-10-CM | POA: Diagnosis present

## 2012-04-03 DIAGNOSIS — E860 Dehydration: Secondary | ICD-10-CM | POA: Diagnosis present

## 2012-04-03 DIAGNOSIS — E039 Hypothyroidism, unspecified: Secondary | ICD-10-CM | POA: Diagnosis present

## 2012-04-03 DIAGNOSIS — D696 Thrombocytopenia, unspecified: Secondary | ICD-10-CM | POA: Diagnosis present

## 2012-04-03 DIAGNOSIS — N179 Acute kidney failure, unspecified: Secondary | ICD-10-CM | POA: Diagnosis present

## 2012-04-03 LAB — BASIC METABOLIC PANEL
CO2: 16 mEq/L — ABNORMAL LOW (ref 19–32)
Chloride: 93 mEq/L — ABNORMAL LOW (ref 96–112)
Glucose, Bld: 72 mg/dL (ref 70–99)
Potassium: 5.5 mEq/L — ABNORMAL HIGH (ref 3.5–5.1)
Sodium: 131 mEq/L — ABNORMAL LOW (ref 135–145)

## 2012-04-03 LAB — CBC
HCT: 36.1 % — ABNORMAL LOW (ref 39.0–52.0)
Hemoglobin: 12.3 g/dL — ABNORMAL LOW (ref 13.0–17.0)
MCH: 28.7 pg (ref 26.0–34.0)
MCHC: 34.1 g/dL (ref 30.0–36.0)
MCV: 84.3 fL (ref 78.0–100.0)

## 2012-04-03 LAB — URINALYSIS, ROUTINE W REFLEX MICROSCOPIC
Bilirubin Urine: NEGATIVE
Nitrite: NEGATIVE
Specific Gravity, Urine: 1.015 (ref 1.005–1.030)
Urobilinogen, UA: 0.2 mg/dL (ref 0.0–1.0)
pH: 5 (ref 5.0–8.0)

## 2012-04-03 LAB — CBC WITH DIFFERENTIAL/PLATELET
Basophils Relative: 0 % (ref 0–1)
Eosinophils Relative: 0 % (ref 0–5)
HCT: 36 % — ABNORMAL LOW (ref 39.0–52.0)
Hemoglobin: 12.4 g/dL — ABNORMAL LOW (ref 13.0–17.0)
Lymphs Abs: 1.9 10*3/uL (ref 0.7–4.0)
MCH: 29 pg (ref 26.0–34.0)
MCV: 84.3 fL (ref 78.0–100.0)
Monocytes Absolute: 1.3 10*3/uL — ABNORMAL HIGH (ref 0.1–1.0)
Monocytes Relative: 2 % — ABNORMAL LOW (ref 3–12)
RBC: 4.27 MIL/uL (ref 4.22–5.81)
WBC: 62.7 10*3/uL (ref 4.0–10.5)

## 2012-04-03 LAB — CREATININE, SERUM: Creatinine, Ser: 5.08 mg/dL — ABNORMAL HIGH (ref 0.50–1.35)

## 2012-04-03 LAB — URINE MICROSCOPIC-ADD ON

## 2012-04-03 MED ORDER — ENOXAPARIN SODIUM 30 MG/0.3ML ~~LOC~~ SOLN
30.0000 mg | SUBCUTANEOUS | Status: DC
  Administered 2012-04-04 – 2012-04-07 (×4): 30 mg via SUBCUTANEOUS
  Filled 2012-04-03 (×6): qty 0.3

## 2012-04-03 MED ORDER — LEVOTHYROXINE SODIUM 100 MCG PO TABS
100.0000 ug | ORAL_TABLET | Freq: Every day | ORAL | Status: DC
  Administered 2012-04-04 – 2012-04-09 (×5): 100 ug via ORAL
  Filled 2012-04-03 (×7): qty 1

## 2012-04-03 MED ORDER — ATENOLOL 50 MG PO TABS
50.0000 mg | ORAL_TABLET | Freq: Every day | ORAL | Status: DC
  Administered 2012-04-04: 50 mg via ORAL
  Filled 2012-04-03: qty 1

## 2012-04-03 MED ORDER — ONDANSETRON HCL 4 MG/2ML IJ SOLN: 4.0000 mg | Freq: Four times a day (QID) | INTRAMUSCULAR | Status: DC | PRN

## 2012-04-03 MED ORDER — AMITRIPTYLINE HCL 50 MG PO TABS
50.0000 mg | ORAL_TABLET | Freq: Every day | ORAL | Status: DC
  Administered 2012-04-03 – 2012-04-08 (×6): 50 mg via ORAL
  Filled 2012-04-03 (×7): qty 1

## 2012-04-03 MED ORDER — ONDANSETRON HCL 4 MG PO TABS
4.0000 mg | ORAL_TABLET | Freq: Four times a day (QID) | ORAL | Status: DC | PRN
  Administered 2012-04-09: 4 mg via ORAL
  Filled 2012-04-03: qty 1

## 2012-04-03 MED ORDER — SODIUM CHLORIDE 0.9 % IV BOLUS (SEPSIS)
1000.0000 mL | Freq: Once | INTRAVENOUS | Status: AC
  Administered 2012-04-03: 1000 mL via INTRAVENOUS

## 2012-04-03 MED ORDER — LEVOFLOXACIN IN D5W 750 MG/150ML IV SOLN
750.0000 mg | Freq: Once | INTRAVENOUS | Status: AC
  Administered 2012-04-03: 750 mg via INTRAVENOUS
  Filled 2012-04-03: qty 150

## 2012-04-03 MED ORDER — SODIUM CHLORIDE 0.9 % IJ SOLN
3.0000 mL | Freq: Two times a day (BID) | INTRAMUSCULAR | Status: DC
  Administered 2012-04-05 – 2012-04-08 (×5): 3 mL via INTRAVENOUS

## 2012-04-03 MED ORDER — ENOXAPARIN SODIUM 40 MG/0.4ML ~~LOC~~ SOLN: 40.0000 mg | SUBCUTANEOUS | Status: DC

## 2012-04-03 MED ORDER — LEVOFLOXACIN IN D5W 500 MG/100ML IV SOLN
500.0000 mg | INTRAVENOUS | Status: DC
  Administered 2012-04-05: 500 mg via INTRAVENOUS
  Filled 2012-04-03: qty 100

## 2012-04-03 MED ORDER — SODIUM CHLORIDE 0.9 % IV SOLN
INTRAVENOUS | Status: AC
  Administered 2012-04-03: 23:00:00 via INTRAVENOUS

## 2012-04-03 MED ORDER — LIDOCAINE HCL 2 % EX GEL
Freq: Once | CUTANEOUS | Status: DC
  Filled 2012-04-03: qty 20

## 2012-04-03 MED ORDER — GUAIFENESIN-DM 100-10 MG/5ML PO SYRP
5.0000 mL | ORAL_SOLUTION | ORAL | Status: DC | PRN
  Administered 2012-04-04: 5 mL via ORAL
  Filled 2012-04-03 (×2): qty 5

## 2012-04-03 MED ORDER — LEVOFLOXACIN IN D5W 500 MG/100ML IV SOLN: 500.0000 mg | Freq: Once | INTRAVENOUS | Status: DC

## 2012-04-03 MED ORDER — BIOTENE DRY MOUTH MT LIQD
15.0000 mL | Freq: Two times a day (BID) | OROMUCOSAL | Status: DC
  Administered 2012-04-04 – 2012-04-05 (×2): 15 mL via OROMUCOSAL

## 2012-04-03 MED ORDER — CHLORHEXIDINE GLUCONATE 0.12 % MT SOLN
15.0000 mL | Freq: Two times a day (BID) | OROMUCOSAL | Status: DC
  Administered 2012-04-04 – 2012-04-08 (×9): 15 mL via OROMUCOSAL
  Filled 2012-04-03 (×14): qty 15

## 2012-04-03 NOTE — H&P (Signed)
PCP:   Nadean Corwin, MD   Chief Complaint:  Fever, chills  HPI: 79 yo male h/o complete heartblock s/p dual chamber pacemaker placement, prostate cancer, throat cancer comes in with about 10 days of cough, congestion which over the last day or so having fever and chills. Has not been eating very well.  Has not been urinating as much as normal.  No n/v.  Myalgias.  No cp.  No le edema.    Review of Systems:  Negative  Past Medical History: Past Medical History  Diagnosis Date  . Prostate cancer   . Hypertension   . Cancer   . Thyroid disease     hypothyroidism  . Hyperlipidemia   . GERD (gastroesophageal reflux disease)    Past Surgical History  Procedure Date  . Pacemaker insertion     dul chamber St Jude 06/14/03  . Cataract extraction     Medications: Prior to Admission medications   Medication Sig Start Date End Date Taking? Authorizing Provider  amitriptyline (ELAVIL) 50 MG tablet Take 50 mg by mouth at bedtime.     Yes Historical Provider, MD  atenolol (TENORMIN) 50 MG tablet Take 50 mg by mouth daily.     Yes Historical Provider, MD  azelastine (ASTELIN) 137 MCG/SPRAY nasal spray Place 1 spray into the nose 2 (two) times daily. Use in each nostril as directed    Yes Historical Provider, MD  Cholecalciferol (VITAMIN D-3 PO) Take 4,000 Units by mouth.     Yes Historical Provider, MD  lansoprazole (PREVACID) 30 MG capsule Take 30 mg by mouth daily.    Yes Historical Provider, MD  levothyroxine (SYNTHROID, LEVOTHROID) 100 MCG tablet Take 100 mcg by mouth daily.     Yes Historical Provider, MD  pravastatin (PRAVACHOL) 40 MG tablet Take 40 mg by mouth daily.     Yes Historical Provider, MD  triamterene-hydrochlorothiazide (DYAZIDE) 37.5-25 MG per capsule Take 1 capsule by mouth every morning.     Yes Historical Provider, MD    Allergies:   Allergies  Allergen Reactions  . Penicillins     Social History:  reports that he has quit smoking. He does not have  any smokeless tobacco history on file. He reports that he does not drink alcohol. His drug history not on file.  Family History: Family History  Problem Relation Age of Onset  . Heart disease Mother 87  dad died of leukemia  Physical Exam: Filed Vitals:   04/03/12 1538 04/03/12 1550 04/03/12 1731 04/03/12 1900  BP:   118/59 113/57  Pulse:   80 83  Temp: 98.6 F (37 C)     TempSrc: Rectal     Resp:   20 22  SpO2:  99% 99% 96%   General appearance: alert, cooperative and no distress Neck: no JVD and supple, symmetrical, trachea midline Lungs: clear to auscultation bilaterally Heart: regular rate and rhythm, S1, S2 normal, no murmur, click, rub or gallop Abdomen: soft, non-tender; bowel sounds normal; no masses,  no organomegaly Extremities: extremities normal, atraumatic, no cyanosis or edema Pulses: 2+ and symmetric Skin: Skin color, texture, turgor normal. No rashes or lesions Neurologic: Grossly normal    Labs on Admission:   [facility] 04/03/12 1550  NA 131*  K 5.5*  CL 93*  CO2 16*  GLUCOSE 72  BUN 111*  CREATININE 5.42*  CALCIUM 8.7  MG --  PHOS --    Basename 04/03/12 1550  WBC 62.7*  NEUTROABS 59.5*  HGB 12.4*  HCT 36.0*  MCV 84.3  PLT 118*   Radiological Exams on Admission: Dg Chest 2 View  04/03/2012  *RADIOLOGY REPORT*  Clinical Data: Cough.  Fever.  Shortness of breath.  Generalized body aches.  Diarrhea.  Indwelling pacemaker.  CHEST - 2 VIEW  Comparison: Two-view chest x-ray 08/04/2007, 06/22/1006, 05/26/2004, 05/22/2003.  Findings: Cardiomediastinal silhouette unremarkable, unchanged. Left subclavian dual lead transvenous pacemaker unchanged and appears intact.  Scarring in the lower lobes, unchanged.  Lungs otherwise clear.  Stable mild hyperinflation.  No visible pleural effusions.  Degenerative changes involving the thoracic spine.  No significant interval change.  IMPRESSION: Stable mild hyperinflation consistent with COPD and/or asthma. Stable  scarring in the lower lobes.  No acute cardiopulmonary disease.   Original Report Authenticated By: Hulan Saas, M.D.     Assessment/Plan  79 yo male with uri symptoms for over [name] week, renal failure and significant leukocytosis over 60  Principal Problem:  *Acute renal failure Active Problems:  THROAT CANCER  HYPERTENSION  PERSONAL HISTORY MALIGNANT NEOPLASM PROSTATE  PPM-St.Jude  Atrioventricular block, complete  Leukocytosis severe over 60  Cover with levaquin for resp and urine.  Urine cx sent.  ivf overnight.  Renal studies and u/s.  Hematology has been called for further evaluation and w/u of wbc of 60 and will see in am.  Place on tele.    Scott [name] 04/03/2012, 8:16 PM

## 2012-04-03 NOTE — ED Notes (Signed)
Pt presents to department for evaluation of cough, SOB, fever, body aches and diarrhea. Ongoing x1 week. Expiratory wheezing noted upon arrival. Respirations unlabored. Warm to touch. Pt is alert and oriented x4. States he is hurting all over body.

## 2012-04-03 NOTE — Progress Notes (Signed)
ANTIBIOTIC CONSULT NOTE - INITIAL  Pharmacy Consult for levofloxacin Indication: R/o UTI, r/o respiratory infection  Allergies  Allergen Reactions  . Penicillins     Patient Measurements: Height: 6' (182.9 cm) Weight: 210 lb 11.2 oz (95.573 kg) IBW/kg (Calculated) : 77.6  Vital Signs: Temp: 97.6 F (36.4 C) (01/26 2204) Temp src: Oral (01/26 2204) BP: 99/52 mmHg (01/26 2204) Pulse Rate: 83  (01/26 2204) Intake/Output from previous day:   Intake/Output from this shift:    Labs:  Basename 04/03/12 1550  WBC 62.7*  HGB 12.4*  PLT 118*  LABCREA --  CREATININE 5.42*   Estimated Creatinine Clearance: 12.2 ml/min (by C-G formula based on Cr of 5.42). No results found for this basename: VANCOTROUGH:2,VANCOPEAK:2,VANCORANDOM:2,GENTTROUGH:2,GENTPEAK:2,GENTRANDOM:2,TOBRATROUGH:2,TOBRAPEAK:2,TOBRARND:2,AMIKACINPEAK:2,AMIKACINTROU:2,AMIKACIN:2, in the last 72 hours   Microbiology: No results found for this or any previous visit (from the past 720 hour(s)).  Medical History: Past Medical History  Diagnosis Date  . Prostate cancer   . Hypertension   . Cancer   . Thyroid disease     hypothyroidism  . Hyperlipidemia   . GERD (gastroesophageal reflux disease)     Medications:  Scheduled:    . amitriptyline  50 mg Oral QHS  . antiseptic oral rinse  15 mL Mouth Rinse q12n4p  . atenolol  50 mg Oral Daily  . chlorhexidine  15 mL Mouth Rinse BID  . enoxaparin (LOVENOX) injection  30 mg Subcutaneous Q24H  . levofloxacin (LEVAQUIN) IV  500 mg Intravenous Q48H  . [COMPLETED] levofloxacin (LEVAQUIN) IV  750 mg Intravenous Once  . levothyroxine  100 mcg Oral QAC breakfast  . [COMPLETED] sodium chloride  1,000 mL Intravenous Once  . sodium chloride  3 mL Intravenous Q12H  . [DISCONTINUED] enoxaparin (LOVENOX) injection  40 mg Subcutaneous Q24H  . [DISCONTINUED] levofloxacin (LEVAQUIN) IV  500 mg Intravenous Once   Assessment: 79 yo male admitted with r/o UTI and r/o  respiratory infection. Pharmacy to manage levofloxacin. Patient has already received levofloxacin 750mg  IV x 1 at ~ 20:00.   Plan:  1. Levofloxacin 500mg  IV Q48H.   Emeline Gins 04/03/2012,10:47 PM

## 2012-04-03 NOTE — ED Provider Notes (Signed)
History     CSN: [id number]  Arrival date & time 04/03/12  1448   First MD Initiated Contact with Patient 04/03/12 1541      Chief Complaint  Patient presents with  . Cough  . Shortness of Breath  . Fever  . Generalized Body Aches  . Diarrhea    (Consider location/radiation/quality/duration/timing/severity/associated sxs/prior treatment) HPI  Complains of subjective fever. Diarrhea diminished appetite and generalized weakness for approximately 10 days. No treatment prior to coming here. Patient states he wheezes when he lies down, unchanged for the past 6 months. He was short of breath earlier today, denies shortness of breath presently. Admits to cough, nonproductive, fairly mild. No treatment prior to coming here wheezing worse with lying supine. Other associated symptoms include diminished appetite. No vomiting. No nausea. Diffuse myalgias Complains of Past Medical History  Diagnosis Date  . Prostate cancer   . Hypertension   . Cancer   . Thyroid disease     hypothyroidism  . Hyperlipidemia   . GERD (gastroesophageal reflux disease)     Past Surgical History  Procedure Date  . Pacemaker insertion     dul chamber St Jude 06/14/03  . Cataract extraction     Family History  Problem Relation Age of Onset  . Heart disease Mother 50    History  Substance Use Topics  . Smoking status: Former Games developer  . Smokeless tobacco: Not on file  . Alcohol Use: No   No drug use   Review of Systems  Constitutional: Positive for fever.  Respiratory: Positive for shortness of breath and wheezing.   Musculoskeletal: Positive for back pain.       Back pain for 10 days  Neurological: Positive for weakness.       Generalized weakness  All other systems reviewed and are negative.    Allergies  Penicillins  Home Medications   Current Outpatient Rx  Name  Route  Sig  Dispense  Refill  . AMITRIPTYLINE HCL 50 MG PO TABS   Oral   Take 50 mg by mouth at bedtime.           .  ATENOLOL 50 MG PO TABS   Oral   Take 50 mg by mouth daily.           . AZELASTINE HCL 137 MCG/SPRAY NA SOLN   Nasal   Place 1 spray into the nose 2 (two) times daily. Use in each nostril as directed          . VITAMIN D-3 PO   Oral   Take 4,000 Units by mouth.           Marland Kitchen LANSOPRAZOLE 30 MG PO CPDR   Oral   Take 30 mg by mouth daily.          Marland Kitchen LEVOTHYROXINE SODIUM 100 MCG PO TABS   Oral   Take 100 mcg by mouth daily.           Marland Kitchen PRAVASTATIN SODIUM 40 MG PO TABS   Oral   Take 40 mg by mouth daily.           . TRIAMTERENE-HCTZ 37.5-25 MG PO CAPS   Oral   Take 1 capsule by mouth every morning.             BP 108/64  Pulse 79  Temp 98.6 F (37 C) (Rectal)  Resp 24  SpO2 100%  Physical Exam  Nursing note and vitals reviewed. Constitutional: He is oriented to  person, place, and time.       Chronically ill-appearing  HENT:  Head: Normocephalic and atraumatic.  Eyes: Conjunctivae normal are normal. Pupils are equal, round, and reactive to light.  Neck: Neck supple. No tracheal deviation present. No thyromegaly present.  Cardiovascular: Normal rate and regular rhythm.   No murmur heard. Pulmonary/Chest: Effort normal and breath sounds normal.       Expiratory wheezes  Abdominal: Soft. Bowel sounds are normal. He exhibits no distension. There is no tenderness.  Genitourinary:       Normal male genitalia  Musculoskeletal: Normal range of motion. He exhibits no edema and no tenderness.       Back without point tenderness or flank tenderness.   Neurological: He is alert and oriented to person, place, and time. Coordination normal.  Skin: Skin is warm and dry. No rash noted.  Psychiatric: He has a normal mood and affect.    ED Course  Procedures (including critical care time)   Labs Reviewed  BASIC METABOLIC PANEL  CBC WITH DIFFERENTIAL  URINALYSIS, ROUTINE W REFLEX MICROSCOPIC   No results found.  Results for orders placed during the hospital  encounter of 04/03/12  BASIC METABOLIC PANEL      Component Value Range   Sodium 131 (*) 135 - 145 mEq/L   Potassium 5.5 (*) 3.5 - 5.1 mEq/L   Chloride 93 (*) 96 - 112 mEq/L   CO2 16 (*) 19 - 32 mEq/L   Glucose, Bld 72  70 - 99 mg/dL   BUN 161 (*) 6 - 23 mg/dL   Creatinine, Ser 0.96 (*) 0.50 - 1.35 mg/dL   Calcium 8.7  8.4 - 04.5 mg/dL   GFR calc non Af Amer 9 (*) >90 mL/min   GFR calc Af Amer 10 (*) >90 mL/min  CBC WITH DIFFERENTIAL      Component Value Range   WBC 62.7 (*) 4.0 - 10.5 K/uL   RBC 4.27  4.22 - 5.81 MIL/uL   Hemoglobin 12.4 (*) 13.0 - 17.0 g/dL   HCT 40.9 (*) 81.1 - 91.4 %   MCV 84.3  78.0 - 100.0 fL   MCH 29.0  26.0 - 34.0 pg   MCHC 34.4  30.0 - 36.0 g/dL   RDW 78.2  95.6 - 21.3 %   Platelets 118 (*) 150 - 400 K/uL   Neutrophils Relative 95 (*) 43 - 77 %   Lymphocytes Relative 3 (*) 12 - 46 %   Monocytes Relative 2 (*) 3 - 12 %   Eosinophils Relative 0  0 - 5 %   Basophils Relative 0  0 - 1 %   Neutro Abs 59.5 (*) 1.7 - 7.7 K/uL   Lymphs Abs 1.9  0.7 - 4.0 K/uL   Monocytes Absolute 1.3 (*) 0.1 - 1.0 K/uL   Eosinophils Absolute 0.0  0.0 - 0.7 K/uL   Basophils Absolute 0.0  0.0 - 0.1 K/uL   RBC Morphology BURR CELLS     WBC Morphology INCREASED BANDS (>20% BANDS)    URINALYSIS, ROUTINE W REFLEX MICROSCOPIC      Component Value Range   Color, Urine AMBER (*) YELLOW   APPearance CLOUDY (*) CLEAR   Specific Gravity, Urine 1.015  1.005 - 1.030   pH 5.0  5.0 - 8.0   Glucose, UA NEGATIVE  NEGATIVE mg/dL   Hgb urine dipstick LARGE (*) NEGATIVE   Bilirubin Urine NEGATIVE  NEGATIVE   Ketones, ur NEGATIVE  NEGATIVE mg/dL   Protein,  ur 30 (*) NEGATIVE mg/dL   Urobilinogen, UA 0.2  0.0 - 1.0 mg/dL   Nitrite NEGATIVE  NEGATIVE   Leukocytes, UA LARGE (*) NEGATIVE  URINE MICROSCOPIC-ADD ON      Component Value Range   Squamous Epithelial / LPF RARE  RARE   WBC, UA 21-50  <3 WBC/hpf   RBC / HPF 11-20  <3 RBC/hpf   Bacteria, UA FEW (*) RARE   Dg Chest 2  View  04/03/2012  *RADIOLOGY REPORT*  Clinical Data: Cough.  Fever.  Shortness of breath.  Generalized body aches.  Diarrhea.  Indwelling pacemaker.  CHEST - 2 VIEW  Comparison: Two-view chest x-ray 08/04/2007, 06/22/1006, 05/26/2004, 05/22/2003.  Findings: Cardiomediastinal silhouette unremarkable, unchanged. Left subclavian dual lead transvenous pacemaker unchanged and appears intact.  Scarring in the lower lobes, unchanged.  Lungs otherwise clear.  Stable mild hyperinflation.  No visible pleural effusions.  Degenerative changes involving the thoracic spine.  No significant interval change.  IMPRESSION: Stable mild hyperinflation consistent with COPD and/or asthma. Stable scarring in the lower lobes.  No acute cardiopulmonary disease.   Original Report Authenticated By: Hulan Saas, M.D.     No diagnosis found.  Spoke with Hospital pharmacist regarding dose of Levaquin. She suggests Levaquin 750 mg IV in the emergency department followed by Levaquin 500 mg every48 hours Spoke with Dr.Ennever from hematology. He suggests that he will see patient at [facility] tomorrow as consultant   MDM  Case discussed with Dr. Onalee Hua Plan admit intravenous antibiotics, telemetry Diagnosis #1 renal failure #2 dehydration #3 weakness #4 leukocytosis #5 thrombocytopenia #6 uti       Doug Sou, MD 04/03/12 2010

## 2012-04-04 ENCOUNTER — Inpatient Hospital Stay (HOSPITAL_COMMUNITY): Payer: Medicare Other

## 2012-04-04 DIAGNOSIS — N19 Unspecified kidney failure: Secondary | ICD-10-CM

## 2012-04-04 LAB — BASIC METABOLIC PANEL
BUN: 119 mg/dL — ABNORMAL HIGH (ref 6–23)
Calcium: 8.1 mg/dL — ABNORMAL LOW (ref 8.4–10.5)
Creatinine, Ser: 4.89 mg/dL — ABNORMAL HIGH (ref 0.50–1.35)
GFR calc Af Amer: 11 mL/min — ABNORMAL LOW (ref >90)
GFR calc non Af Amer: 10 mL/min — ABNORMAL LOW (ref >90)
Glucose, Bld: 106 mg/dL — ABNORMAL HIGH (ref 70–99)

## 2012-04-04 LAB — INFLUENZA PANEL BY PCR (TYPE A & B)
H1N1 flu by pcr: NOT DETECTED
Influenza A By PCR: NEGATIVE
Influenza B By PCR: NEGATIVE

## 2012-04-04 LAB — CBC
HCT: 34.3 % — ABNORMAL LOW (ref 39.0–52.0)
MCHC: 35.3 g/dL (ref 30.0–36.0)
Platelets: 99 10*3/uL — ABNORMAL LOW (ref 150–400)
RDW: 14.8 % (ref 11.5–15.5)
WBC: 44.6 10*3/uL — ABNORMAL HIGH (ref 4.0–10.5)

## 2012-04-04 LAB — NA AND K (SODIUM & POTASSIUM), RAND UR: Sodium, Ur: 27 mEq/L

## 2012-04-04 LAB — OSMOLALITY, URINE: Osmolality, Ur: 297 mOsm/kg — ABNORMAL LOW (ref 390–1090)

## 2012-04-04 MED ORDER — ATENOLOL 25 MG PO TABS
25.0000 mg | ORAL_TABLET | Freq: Every day | ORAL | Status: DC
  Administered 2012-04-04 – 2012-04-05 (×2): 25 mg via ORAL
  Filled 2012-04-04 (×3): qty 1

## 2012-04-04 NOTE — Progress Notes (Signed)
[facility] PROGRESS NOTE  Scott [name]:[id number] DOB: December 30, 1925 DOA: 04/03/2012 PCP: Nadean Corwin, MD  Assessment/Plan: Principal Problem:  *Acute renal failure Active Problems:  THROAT CANCER  HYPERTENSION  PERSONAL HISTORY MALIGNANT NEOPLASM PROSTATE  PPM-St.Jude  Atrioventricular block, complete  Leukocytosis severe over 60      1. Urinary tract infection continue levofloxacin, followup urine culture 2. Leukocytosis likely leukemoid reaction appreciate hematology oncology evaluation by: Started to improve with IV antibiotics could be secondary to infection 3. Hypertension blood pressure soft patient on atenolol will decrease the dose 4. Throat cancer stable  Code Status: full Family Communication: family updated about patient's clinical progress Disposition Plan:  As above    Brief narrative: 79 yo male h/o complete heartblock s/p dual chamber pacemaker placement, prostate cancer, throat cancer comes in with about 10 days of cough, congestion which over the last day or so having fever and chills. Has not been eating very well. Has not been urinating as much as normal. No n/v. Myalgias. No cp. No le edema   Consultants:  Hematology oncology  Procedures:  None  Antibiotics:  Levofloxacin  HPI/Subjective: Denies any chest pain shortness of breath flank pain  Objective: Filed Vitals:   04/03/12 2030 04/03/12 2204 04/04/12 0441 04/04/12 1023  BP: 106/78 99/52 104/56 111/67  Pulse: 85 83 81 79  Temp: 97.8 F (36.6 C) 97.6 F (36.4 C) 97.4 F (36.3 C) 97.5 F (36.4 C)  TempSrc: Oral Oral Oral Oral  Resp: 14 22 22 18   Height:  6' (1.829 m)    Weight:  95.573 kg (210 lb 11.2 oz)    SpO2: 98% 100% 96% 94%    Intake/Output Summary (Last 24 hours) at 04/04/12 1335 Last data filed at 04/04/12 8119  Gross per 24 hour  Intake   2325 ml  Output   3100 ml  Net   -775 ml    Exam:  General appearance: alert, cooperative and no  distress  Neck: no JVD and supple, symmetrical, trachea midline  Lungs: clear to auscultation bilaterally  Heart: regular rate and rhythm, S1, S2 normal, no murmur, click, rub or gallop  Abdomen: soft, non-tender; bowel sounds normal; no masses, no organomegaly  Extremities: extremities normal, atraumatic, no cyanosis or edema  Pulses: 2+ and symmetric  Skin: Skin color, texture, turgor normal. No rashes or lesions  Neurologic: Grossly normal       Data Reviewed: Basic Metabolic Panel:  Lab 04/04/12 1478 04/03/12 2204 04/03/12 1550  NA 132* -- 131*  K 4.6 -- 5.5*  CL 97 -- 93*  CO2 16* -- 16*  GLUCOSE 106* -- 72  BUN 119* -- 111*  CREATININE 4.89* 5.08* 5.42*  CALCIUM 8.1* -- 8.7  MG -- -- --  PHOS -- -- --    Liver Function Tests: No results found for this basename: AST:5,ALT:5,ALKPHOS:5,BILITOT:5,PROT:5,ALBUMIN:5 in the last 168 hours No results found for this basename: LIPASE:5,AMYLASE:5 in the last 168 hours No results found for this basename: AMMONIA:5 in the last 168 hours  CBC:  Lab 04/04/12 0400 04/03/12 2204 04/03/12 1550  WBC 44.6* 51.1* 62.7*  NEUTROABS -- -- 59.5*  HGB 12.1* 12.3* 12.4*  HCT 34.3* 36.1* 36.0*  MCV 83.5 84.3 84.3  PLT 99* 101* 118*    Cardiac Enzymes: No results found for this basename: CKTOTAL:5,CKMB:5,CKMBINDEX:5,TROPONINI:5 in the last 168 hours BNP (last 3 results) No results found for this basename: PROBNP:3 in the last 8760 hours   CBG: No results found for this  basename: GLUCAP:5 in the last 168 hours  No results found for this or any previous visit (from the past 240 hour(s)).   Studies: Dg Chest 2 View  04/03/2012  *RADIOLOGY REPORT*  Clinical Data: Cough.  Fever.  Shortness of breath.  Generalized body aches.  Diarrhea.  Indwelling pacemaker.  CHEST - 2 VIEW  Comparison: Two-view chest x-ray 08/04/2007, 06/22/1006, 05/26/2004, 05/22/2003.  Findings: Cardiomediastinal silhouette unremarkable, unchanged. Left subclavian  dual lead transvenous pacemaker unchanged and appears intact.  Scarring in the lower lobes, unchanged.  Lungs otherwise clear.  Stable mild hyperinflation.  No visible pleural effusions.  Degenerative changes involving the thoracic spine.  No significant interval change.  IMPRESSION: Stable mild hyperinflation consistent with COPD and/or asthma. Stable scarring in the lower lobes.  No acute cardiopulmonary disease.   Original Report Authenticated By: Hulan Saas, M.D.    US Renal  04/04/2012  *RADIOLOGY REPORT*  Clinical Data: Acute renal failure.  RENAL/URINARY TRACT ULTRASOUND COMPLETE  Comparison:  None.  Findings:  Right Kidney:  14.0 cm.  No hydronephrosis.  Three right renal cysts largest measuring up to 3.4 cm.  Left Kidney:  13.8 cm.  No hydronephrosis. 1.4 cm left upper pole cyst.  The lower pole 2.3 cm rounded structure may be a slightly complex cyst but cannot be confirmed as a simple cyst.  Bladder:  Foley catheter in place and decompressed.  Impression:  No evidence hydronephrosis.  Bilateral renal cysts.  Left lower pole 2.3 cm structure may represent a slightly complex cyst.  This cannot be confirmed as a simple cyst on the present exam.   Original Report Authenticated By: Lacy Duverney, M.D.     Scheduled Meds:   . amitriptyline  50 mg Oral QHS  . antiseptic oral rinse  15 mL Mouth Rinse q12n4p  . atenolol  50 mg Oral Daily  . chlorhexidine  15 mL Mouth Rinse BID  . enoxaparin (LOVENOX) injection  30 mg Subcutaneous Q24H  . levofloxacin (LEVAQUIN) IV  500 mg Intravenous Q48H  . levothyroxine  100 mcg Oral QAC breakfast  . lidocaine   Urethral Once  . sodium chloride  3 mL Intravenous Q12H   Continuous Infusions:   Principal Problem:  *Acute renal failure Active Problems:  THROAT CANCER  HYPERTENSION  PERSONAL HISTORY MALIGNANT NEOPLASM PROSTATE  PPM-St.Jude  Atrioventricular block, complete  Leukocytosis severe over 60    Time spent: 40  minutes   [facility]  [facility] Pager [phone]. If 8PM-8AM, please contact night-coverage at [email or web address], password [facility] 04/04/2012, 1:35 PM  LOS: 1 day

## 2012-04-04 NOTE — Consult Note (Signed)
I will leave a full note. I did look at the blood smear and this looks very reactive.  I do not see any hematologic issue.  It looks like that he has a bad UTI and possible sepsis.  WBC is already improving.  I do not see any evidence of a microangiopathic process (ie TTP).  I believe the low plt are due to infection/sepsis.  I will f/u later today.  Scott E.    Joshua 1:9

## 2012-04-04 NOTE — Progress Notes (Signed)
Attempted to insert coude catheter without success.  14 Fr Foley catheter inserted without difficulty.  Moderate  amount amber urine return with large amt white and pink sediment noted.  Patient tolerated well.

## 2012-04-05 DIAGNOSIS — A498 Other bacterial infections of unspecified site: Secondary | ICD-10-CM

## 2012-04-05 DIAGNOSIS — D696 Thrombocytopenia, unspecified: Secondary | ICD-10-CM

## 2012-04-05 DIAGNOSIS — N179 Acute kidney failure, unspecified: Secondary | ICD-10-CM

## 2012-04-05 LAB — COMPREHENSIVE METABOLIC PANEL
Albumin: 2.2 g/dL — ABNORMAL LOW (ref 3.5–5.2)
BUN: 95 mg/dL — ABNORMAL HIGH (ref 6–23)
Calcium: 8.3 mg/dL — ABNORMAL LOW (ref 8.4–10.5)
Chloride: 102 mEq/L (ref 96–112)
Creatinine, Ser: 3.19 mg/dL — ABNORMAL HIGH (ref 0.50–1.35)
Total Bilirubin: 0.8 mg/dL (ref 0.3–1.2)

## 2012-04-05 LAB — GLUCOSE, CAPILLARY: Glucose-Capillary: 80 mg/dL (ref 70–99)

## 2012-04-05 LAB — URINE CULTURE: Colony Count: 60000

## 2012-04-05 LAB — TROPONIN I
Troponin I: <0.3 ng/mL (ref <0.30)
Troponin I: <0.3 ng/mL (ref <0.30)

## 2012-04-05 MED ORDER — LIDOCAINE 4 % EX CREA
TOPICAL_CREAM | Freq: Every day | CUTANEOUS | Status: DC | PRN
  Administered 2012-04-05: 1 via TOPICAL
  Filled 2012-04-05 (×2): qty 5

## 2012-04-05 MED ORDER — CYCLOBENZAPRINE HCL 5 MG PO TABS
7.5000 mg | ORAL_TABLET | Freq: Three times a day (TID) | ORAL | Status: DC | PRN
  Administered 2012-04-05: 7.5 mg via ORAL
  Filled 2012-04-05: qty 1.5

## 2012-04-05 MED ORDER — ACETAMINOPHEN 650 MG RE SUPP
650.0000 mg | RECTAL | Status: DC | PRN
  Administered 2012-04-05: 650 mg via RECTAL

## 2012-04-05 NOTE — Consult Note (Signed)
#   [id number] is consult note.  Scott [name].  Isaiah 40:31

## 2012-04-05 NOTE — Progress Notes (Addendum)
[facility] PROGRESS NOTE  Jabari BRYLEE MCGREAL RUE:[id number] DOB: Mar 31, 1925 DOA: 04/03/2012 PCP: Nadean Corwin, MD  Assessment/Plan: Principal Problem:  *Acute renal failure Active Problems:  THROAT CANCER  HYPERTENSION  PERSONAL HISTORY MALIGNANT NEOPLASM PROSTATE  PPM-St.Jude  Atrioventricular block, complete  Leukocytosis severe over 60    1. Urinary tract infection due to urinary retension, may need to DC home with foleycontinue levofloxacin, urine culture shows Escherichia coli sensitive to levofloxacin 2. Leukocytosis likely leukemoid reaction appreciate hematology oncology evaluation by: Started to improve with IV antibiotics could be secondary to infection 3. Hypertension blood pressure soft patient on atenolol will decrease the dose 4. Throat cancer stable 5. Thrombocytopenia secondary to his underlying infection, SIRS, repeat CBC, blood smears reassuring, no signs of hematologic malignancy  Code Status: full Family Communication: family updated about patient's clinical progress Disposition Plan:  As above    Brief narrative: Mr. Lindon is a nice 79 year old white  gentleman. He used to work for a [employer]. He has history of "throat  cancer." This was back in 1984. He had surgery for this and does not  look like he has any radiation or chemotherapy.  He also has history of prostate cancer. Again, I am unsure what the  status of this was or how this was treated. There is history of  hypothyroidism and hyperlipidemia.  He came into the hospital because of progressive weakness. He is having  temperatures. He is having some chills.  When he came in, his white cell count was about 63,000. Hemoglobin 12.4  and platelet count 118.  He had urinalysis done. This showed likely UTI. He had marked renal  failure. BUN was 11, creatinine 5.42. Calcium 8.7.  He was started on IV antibiotics. He currently is on Levaquin.  So far, cultures have grown Escherichia coli.  Sensitivities are  pending. Hematology was consulted for leukocytosis   Consultants: Josph Macho, MD   Procedures:  None  Antibiotics:  Levofloxacin started 1/27  HPI/Subjective: Afebrile, no complaints  Objective: Filed Vitals:   04/04/12 1724 04/04/12 2126 04/05/12 0437 04/05/12 0956  BP: 118/69 113/66 92/46 123/64  Pulse: 76 78 69 72  Temp: 97.6 F (36.4 C) 97.8 F (36.6 C) 97.3 F (36.3 C) 98.8 F (37.1 C)  TempSrc: Oral Oral Oral Oral  Resp: 20 20 20 20   Height:      Weight:  96.662 kg (213 lb 1.6 oz)    SpO2: 97% 98% 97% 100%    Intake/Output Summary (Last 24 hours) at 04/05/12 1244 Last data filed at 04/05/12 1100  Gross per 24 hour  Intake    720 ml  Output   3525 ml  Net  -2805 ml    Exam:  HENT:  Head: Atraumatic.  Nose: Nose normal.  Mouth/Throat: Oropharynx is clear and moist.  Eyes: Conjunctivae are normal. Pupils are equal, round, and reactive to light. No scleral icterus.  Neck: Neck supple. No tracheal deviation present.  Cardiovascular: Normal rate, regular rhythm, normal heart sounds and intact distal pulses.  Pulmonary/Chest: Effort normal and breath sounds normal. No respiratory distress.  Abdominal: Soft. Normal appearance and bowel sounds are normal. She exhibits no distension. There is no tenderness.  Musculoskeletal: She exhibits no edema and no tenderness.  Neurological: She is alert. No cranial nerve deficit.    Data Reviewed: Basic Metabolic Panel:  Lab 04/04/12 1478 04/03/12 2204 04/03/12 1550  NA 132* -- 131*  K 4.6 -- 5.5*  CL 97 -- 93*  CO2 16* -- 16*  GLUCOSE 106* -- 72  BUN 119* -- 111*  CREATININE 4.89* 5.08* 5.42*  CALCIUM 8.1* -- 8.7  MG -- -- --  PHOS -- -- --    Liver Function Tests: No results found for this basename: AST:5,ALT:5,ALKPHOS:5,BILITOT:5,PROT:5,ALBUMIN:5 in the last 168 hours No results found for this basename: LIPASE:5,AMYLASE:5 in the last 168 hours No results found for this  basename: AMMONIA:5 in the last 168 hours  CBC:  Lab 04/04/12 0400 04/03/12 2204 04/03/12 1550  WBC 44.6* 51.1* 62.7*  NEUTROABS -- -- 59.5*  HGB 12.1* 12.3* 12.4*  HCT 34.3* 36.1* 36.0*  MCV 83.5 84.3 84.3  PLT 99* 101* 118*    Cardiac Enzymes: No results found for this basename: CKTOTAL:5,CKMB:5,CKMBINDEX:5,TROPONINI:5 in the last 168 hours BNP (last 3 results) No results found for this basename: PROBNP:3 in the last 8760 hours   CBG: No results found for this basename: GLUCAP:5 in the last 168 hours  Recent Results (from the past 240 hour(s))  URINE CULTURE     Status: Normal   Collection Time   04/03/12  6:15 PM      Component Value Range Status Comment   Specimen Description URINE, CLEAN CATCH   Final    Special Requests NONE   Final    Culture  Setup Time 04/04/2012 01:22   Final    Colony Count >=100,000 COLONIES/ML   Final    Culture ESCHERICHIA COLI   Final    Report Status 04/05/2012 FINAL   Final    Organism ID, Bacteria ESCHERICHIA COLI   Final   URINE CULTURE     Status: Normal   Collection Time   04/04/12  1:24 AM      Component Value Range Status Comment   Specimen Description URINE, RANDOM   Final    Special Requests NONE   Final    Culture  Setup Time 04/04/2012 02:38   Final    Colony Count 60,000 COLONIES/ML   Final    Culture ESCHERICHIA COLI   Final    Report Status 04/05/2012 FINAL   Final    Organism ID, Bacteria ESCHERICHIA COLI   Final      Studies: Dg Chest 2 View  04/03/2012  *RADIOLOGY REPORT*  Clinical Data: Cough.  Fever.  Shortness of breath.  Generalized body aches.  Diarrhea.  Indwelling pacemaker.  CHEST - 2 VIEW  Comparison: Two-view chest x-ray 08/04/2007, 06/22/1006, 05/26/2004, 05/22/2003.  Findings: Cardiomediastinal silhouette unremarkable, unchanged. Left subclavian dual lead transvenous pacemaker unchanged and appears intact.  Scarring in the lower lobes, unchanged.  Lungs otherwise clear.  Stable mild hyperinflation.  No  visible pleural effusions.  Degenerative changes involving the thoracic spine.  No significant interval change.  IMPRESSION: Stable mild hyperinflation consistent with COPD and/or asthma. Stable scarring in the lower lobes.  No acute cardiopulmonary disease.   Original Report Authenticated By: Hulan Saas, M.D.    US Renal  04/04/2012  *RADIOLOGY REPORT*  Clinical Data: Acute renal failure.  RENAL/URINARY TRACT ULTRASOUND COMPLETE  Comparison:  None.  Findings:  Right Kidney:  14.0 cm.  No hydronephrosis.  Three right renal cysts largest measuring up to 3.4 cm.  Left Kidney:  13.8 cm.  No hydronephrosis. 1.4 cm left upper pole cyst.  The lower pole 2.3 cm rounded structure may be a slightly complex cyst but cannot be confirmed as a simple cyst.  Bladder:  Foley catheter in place and decompressed.  Impression:  No evidence hydronephrosis.  Bilateral renal cysts.  Left lower pole 2.3 cm structure may represent a slightly complex cyst.  This cannot be confirmed as a simple cyst on the present exam.   Original Report Authenticated By: Lacy Duverney, M.D.     Scheduled Meds:   . amitriptyline  50 mg Oral QHS  . antiseptic oral rinse  15 mL Mouth Rinse q12n4p  . atenolol  25 mg Oral Daily  . chlorhexidine  15 mL Mouth Rinse BID  . enoxaparin (LOVENOX) injection  30 mg Subcutaneous Q24H  . levofloxacin (LEVAQUIN) IV  500 mg Intravenous Q48H  . levothyroxine  100 mcg Oral QAC breakfast  . lidocaine   Urethral Once  . sodium chloride  3 mL Intravenous Q12H   Continuous Infusions:   Principal Problem:  *Acute renal failure Active Problems:  THROAT CANCER  HYPERTENSION  PERSONAL HISTORY MALIGNANT NEOPLASM PROSTATE  PPM-St.Jude  Atrioventricular block, complete  Leukocytosis severe over 60    Time spent: 40 minutes   [facility]  [facility] Pager [phone]. If 8PM-8AM, please contact night-coverage at [email or web address], password [facility] 04/05/2012, 12:44 PM  LOS: 2 days

## 2012-04-05 NOTE — Consult Note (Signed)
NAMEOLLIVER, BOYADJIAN              ACCOUNT NO.:  [id number]  MEDICAL RECORD NO.:  [id number]  LOCATION:  6742                         FACILITY:  [facility]  PHYSICIAN:  Josph Macho, M.D.  DATE OF BIRTH:  29-Jun-1925  DATE OF CONSULTATION:  04/05/2012 DATE OF DISCHARGE:                                CONSULTATION   REASON FOR CONSULTATION: 1. Leukocytosis - likely reactive. 2. Thrombocytopenia - probable infection related.  HISTORY OF PRESENT ILLNESS:  Mr. Parton is a nice 79 year old white gentleman.  He used to work for a [employer].  He has history of "throat cancer."  This was back in 1984.  He had surgery for this and does not look like he has any radiation or chemotherapy.  He also has history of prostate cancer.  Again, I am unsure what the status of this was or how this was treated.  There is history of hypothyroidism and hyperlipidemia.  He came into the hospital because of progressive weakness.  He is having temperatures.  He is having some chills.  When he came in, his white cell count was about 63,000.  Hemoglobin 12.4 and platelet count 118.  He had urinalysis done.  This showed likely UTI.  He had marked renal failure.  BUN was 11, creatinine 5.42.  Calcium 8.7.  He was started on IV antibiotics.  He currently is on Levaquin.  So far, cultures have grown Escherichia coli.  Sensitivities are pending.  Again, we are asked to see him because of leukocytosis.  After some IV fluids and initiation of IV antibiotics, his white cell count went down to 45,000.  Hemoglobin 12.1, platelet count 99.  He really cannot give too much in the way of history.  He is a real nice guy.  He is complaining a lot of thirst.  There is no obvious pain.  He has had no bleeding.  He has had a decent appetite.  There has been some nausea.  PAST MEDICAL HISTORY:  As stated in the history of present illness, he has: 1. Prostate cancer. 2. Throat cancer. 3. Hypertension. 4.  Hypothyroidism. 5. Hyperlipidemia. 6. GERD.  ALLERGIES:  PENICILLIN.  MEDICATIONS:  Home Medications: 1. Elavil 50 mg p.o. at bedtime 2. Tenormin 50 mg p.o. daily. 3. Astelin nasal spray, 1 spray into the nose twice a day. 4. Vitamin D3 4000 units p.o. daily. 5. Prevacid 30 mg p.o. daily. 6. Synthroid 0.1 mg p.o. daily. 7. Pravachol 40 mg p.o. daily. 8. Dyazide (37.5/25) 1 p.o. daily.  SOCIAL HISTORY:  Remarkable for past tobacco use, he stopped back in 1984.  He has no obvious alcohol use.  He used to work for [employer].  FAMILY HISTORY:  Remarkable for his father died of, I guess, leukemia.  REVIEW OF SYSTEMS:  As stated in the history of present illness.  He does have a pacemaker that was placed back in 2005.  PHYSICAL EXAMINATION:  GENERAL:  This is an elderly white gentleman, in obvious distress.  He is somewhat alert and oriented. VITAL SIGNS:  Temperature 97.3, pulse 69, respiratory rate 20, blood pressure 92/46. HEENT:  Head and neck exam shows normocephalic, atraumatic skull.  There are no ocular or oral lesions.  There are no palpable cervical or supraclavicular lymph nodes. LUNGS:  Clear bilaterally. CARDIAC:  Regular rate and rhythm with normal S1, S2.  He does have an occasional extra beat.  He has 1/6 systolic ejection murmur. ABDOMEN:  Soft.  There is no fluid wave.  There is no palpable abdominal mass.  There is no palpable hepatosplenomegaly. EXTREMITIES:  No clubbing, cyanosis, or edema. SKIN:  No rashes.  LABORATORY DATA:  His laboratory studies today are pending.  Peripheral smear shows marked leukocytosis.  He has exclusively reactive leukocytes.  He has a lot of segs and bands.  I do not see any immature myeloid cells.  There is no atypical lymphocytes.  There are no blasts. No nucleated red blood cells.  He has no teardrop cells.  There is no rouleaux formation.  Platelets are slightly decreased in number.  He has several large  platelets.  IMPRESSION:  Mr. Ferrara is a very nice 79 year old white gentleman.  He has leukocytosis.  Again, the blood smear is key in this situation.  It certainly appears as if this is reactive.  He seems to be improving.  We will see what the white cell count is today.  He has Escherichia coli in his urine.  I suspect this is why he has thrombocytopenia.  He has perhaps some platelets destruction from the infection.  For now, I would just recommend close observation of the blood count.  I suspect that his platelets will continue to drop before they start coming back up.  The blood smear, to me, is very reassuring that there is no underlying hematologic malignancy.  We will follow Mr. Molyneux as indicated.     Josph Macho, M.D.     PRE/MEDQ  D:  04/05/2012  T:  04/05/2012  Job:  [id number]

## 2012-04-06 ENCOUNTER — Inpatient Hospital Stay (HOSPITAL_COMMUNITY): Payer: Medicare Other

## 2012-04-06 DIAGNOSIS — C61 Malignant neoplasm of prostate: Secondary | ICD-10-CM

## 2012-04-06 DIAGNOSIS — E785 Hyperlipidemia, unspecified: Secondary | ICD-10-CM

## 2012-04-06 DIAGNOSIS — C140 Malignant neoplasm of pharynx, unspecified: Secondary | ICD-10-CM

## 2012-04-06 DIAGNOSIS — R7989 Other specified abnormal findings of blood chemistry: Secondary | ICD-10-CM

## 2012-04-06 LAB — BASIC METABOLIC PANEL
Calcium: 7.9 mg/dL — ABNORMAL LOW (ref 8.4–10.5)
Creatinine, Ser: 2.7 mg/dL — ABNORMAL HIGH (ref 0.50–1.35)
GFR calc Af Amer: 23 mL/min — ABNORMAL LOW (ref >90)

## 2012-04-06 LAB — CK TOTAL AND CKMB (NOT AT ARMC)
CK, MB: 4.4 ng/mL — ABNORMAL HIGH (ref 0.3–4.0)
CK, MB: 4.1 ng/mL — ABNORMAL HIGH (ref 0.3–4.0)
Relative Index: INVALID (ref 0.0–2.5)
Relative Index: INVALID (ref 0.0–2.5)

## 2012-04-06 LAB — TROPONIN I
Troponin I: 0.41 ng/mL (ref <0.30)
Troponin I: 0.3 ng/mL (ref <0.30)

## 2012-04-06 LAB — CBC
MCH: 28.8 pg (ref 26.0–34.0)
MCHC: 35.4 g/dL (ref 30.0–36.0)
Platelets: 92 10*3/uL — ABNORMAL LOW (ref 150–400)
RBC: 4.16 MIL/uL — ABNORMAL LOW (ref 4.22–5.81)

## 2012-04-06 MED ORDER — SODIUM CHLORIDE 0.9 % IV SOLN
INTRAVENOUS | Status: DC
  Administered 2012-04-06: 11:00:00 via INTRAVENOUS
  Administered 2012-04-07: 75 mL/h via INTRAVENOUS
  Administered 2012-04-07: 03:00:00 via INTRAVENOUS
  Administered 2012-04-08: 75 mL/h via INTRAVENOUS
  Administered 2012-04-08: 22:00:00 via INTRAVENOUS

## 2012-04-06 MED ORDER — LEVOFLOXACIN IN D5W 750 MG/150ML IV SOLN
750.0000 mg | INTRAVENOUS | Status: DC
  Administered 2012-04-07: 750 mg via INTRAVENOUS
  Filled 2012-04-06: qty 150

## 2012-04-06 MED ORDER — SODIUM CHLORIDE 0.9 % IV BOLUS (SEPSIS)
500.0000 mL | Freq: Once | INTRAVENOUS | Status: AC
  Administered 2012-04-06: 500 mL via INTRAVENOUS

## 2012-04-06 MED ORDER — IBUPROFEN 800 MG PO TABS
800.0000 mg | ORAL_TABLET | Freq: Once | ORAL | Status: AC
  Administered 2012-04-06: 800 mg via ORAL
  Filled 2012-04-06: qty 1

## 2012-04-06 NOTE — Progress Notes (Signed)
This RN walked in pt room around 2245 to find pt sitting in chair screaming for help, shivering and very anxious. States that he is freezing and just feels bad, unable to elaborate on how he feels. Vitals taken, BP=125/98, temp=102.1(rectal)unable to obtain reliable O2 sat/HR d/t extreme chills and shivering/rigors, HR=100-140, sat=83? No complaints of chest pain or SOB but pt does have increased work of breathing, lungs clear bilaterally. 2300 Rapid Response at bedside. Tama Gander, NP (o/c for [facility]) called, new orders received. Tylenol given via suppository at 2305, stripped bed linen down to just sheet, placed on NonRebreather to assist with oxygenation but pt continues to try and pull off mask-still unable to obtain sat. Katherine to bedside.  0130 Called Tama Gander, NP with update. Pt states he still hurts but no longer shivering. Temp still elevatd at 102(rectal). Orders received, 500cc bolus given along with PO Ibuprofen. Pt swithed to [place] at 2L, satting in the mid 90s, no s/s of distress. Will continue monitoring  0400 Lab called with elevated Troponin levels, results paged to [facility]. Pt resting comfortably at this time, afebrile. No new orders received.

## 2012-04-06 NOTE — Progress Notes (Signed)
Event: 2245:  RN notified me that pt was c/o hurting all over and not feeling well and now w/ temp of 102.1 after being afebrile > 24 hours. Pt very tremulous and she is unable to get 02 sat reading d/t same. Pt would not take PO tylenol. Tylenol supp ordered. NP to bedside. Subjective: Pt unable to provide much information d/t severe rigors. He does report that "hurts all over". Denies CP, nausea or abd pain.  Objective: Mr Maddocks is an 79 y/o gentleman admitted 04/03/2012 after presenting to ED w/ c/o 10 day h/o cough, congestion, fever and chills. CXR was w/o acute findings but pt was found to have a UTI. Pt also noted to have acute renal failure whioch has improved since admission w/ IVF's. Leukocytosis has improved since admission from 44.6 to 22.1 after treatment w/ Levaquin. At bedside pt noted w/ significant rigors. Skin cool and dry. Unable to obtain reliable 02 sat reading. Current VS, T-102.1, BP-125/98, P-90, R-36-40. BBS diminished but otherwise CTA w/ occasional cough. Abd is distended but soft, nt w/ normal bs. After approx 30 min rigors began to subside and we were able to get 02 sat reading of 98% on 2L [place].  CXR pending and blood cultures x 2 drawn.  Assessment/Plan: 1. Fever: Presumed related to infectious process (UTI) though concerning after being afebrile >24 hrs after initiation of antibiotics.  CXR, blood cultures pending.  Reassessment: 0300: RN reports pt now afebrile and has been sleeping w/o further distress. VSS. Will f/u CXR and continue to monitor closely.   Leanne Chang, NP-C [facility] Pager [phone]

## 2012-04-06 NOTE — Progress Notes (Signed)
  ANTIBIOTIC CONSULT NOTE - FOLLOW UP  Pharmacy Consult for Levaquin Indication: UTI /Sirs/rigors  Allergies  Allergen Reactions  . Penicillins    Patient Measurements: Height: 6' (182.9 cm) Weight: 213 lb 1.6 oz (96.662 kg) IBW/kg (Calculated) : 77.6   Vital Signs: Temp: 97.7 F (36.5 C) (01/29 1357) Temp src: Oral (01/29 1357) BP: 93/58 mmHg (01/29 1357) Pulse Rate: 78  (01/29 1357)  Labs:  Basename 04/06/12 1035 04/06/12 0227 04/05/12 1251 04/04/12 0400 04/03/12 2204  WBC -- 22.1* -- 44.6* 51.1*  HGB -- 12.0* -- 12.1* 12.3*  PLT -- 92* -- 99* 101*  LABCREA -- -- -- -- --  CREATININE 2.70* -- 3.19* 4.89* --   Estimated Creatinine Clearance: 24.5 ml/min (by C-G formula based on Cr of 2.7).  Assessment:  79 yo male being continued on Levaquin (day#4) for E. Coli UTI/Sirs/rigors. Tmax 102.1'F overnight, WBC down 22.1, Scr improving at 2.70 today (CrCl ~24 ml/min).  Antibiotics: Levaquin 1/26 >>  Cultures: Urine 1/26 & 1/27 - E.coli (senstitive to Levaquin) Blood 1/28 - sent  Goal of Therapy:  Clinical improvement  Plan:  1) Increase IV Levaquin to 750mg  Q48 hours 2) Continue to monitor renal function. clinical status, and LOT  Benjaman Pott, PharmD    04/06/2012   2:52 PM

## 2012-04-06 NOTE — Progress Notes (Signed)
Patient ID: Scott [name]  male  WGN:[id number]    DOB: 11-30-1925    DOA: 04/03/2012  PCP: Nadean Corwin, MD  Assessment/Plan: Principal Problem:  *Acute renal failure Active Problems:  THROAT CANCER  HYPERTENSION  PERSONAL HISTORY MALIGNANT NEOPLASM PROSTATE  PPM-[employer]  Atrioventricular block, complete  Leukocytosis severe over 60  Elevated troponin   1. SIRS/rigors/ Urinary tract infection: due to urinary retention, may need to DC home with foley, continue levofloxacin, urine culture shows Escherichia coli sensitive to levofloxacin. Renal ultrasound shows no hydronephrosis or renal abscess. Blood cultures obtained last night, will follow results. 2. Leukocytosis likely leukemoid reaction versus sepsis. Hematology following, trending down after starting IV antibiotics 3. Hypotension: Likely due to hypovolemia, sepsis vs atenolol. DC atenolol, continue IV antibiotics, placed on IV fluid hydration today.  4. Throat cancer stable 5. Thrombocytopenia secondary to his underlying infection, SIRS, follow counts. Per hematology, blood smear is reassuring and no underlying hematology malignancy identified.  6. Elevated troponin: Patient denies any chest pain or shortness of breath, will continue to trend troponin. If continues to trend up, obtain 2-D echocardiogram for further workup 7. Abnormal chest x-ray: Chest x-ray obtained this morning shows left mid lung opacity incompletely characterized 2.5 cm. However patient had a two-view chest x-ray on 04/03/2012 which showed COPD/asthma but no opacity. CT chest with contrast cannot be obtained due to renal failure, I will obtain CT chest without contrast. 8. Acute renal failure: Possibly secondary to dehydration, urinary retention and UTI. Renal ultrasound showed no hydronephrosis or renal abscess. Avoid hypotension or any nephrotoxic drugs/ contrast. follow Cr function   DVT Prophylaxis: SCDs  Code  Status:  Disposition:    Subjective: Overnight events noted, rapid response called this morning. On examination patient feels cold however no chest pain, shortness of breath, nausea, vomiting, abdominal pain.    Objective: Weight change:   Intake/Output Summary (Last 24 hours) at 04/06/12 0948 Last data filed at 04/06/12 0528  Gross per 24 hour  Intake    480 ml  Output   2675 ml  Net  -2195 ml   Blood pressure 93/52, pulse 75, temperature 97.5 F (36.4 C), temperature source Oral, resp. rate 22, height 6' (1.829 m), weight 96.662 kg (213 lb 1.6 oz), SpO2 97.00%.  Physical Exam: General: sleepy but easily arousable and appropriately responds to all the questions, not in any acute distress. HEENT: anicteric sclera, pupils reactive to light and accommodation, EOMI CVS: S1-S2 clear, no murmur rubs or gallops Chest: clear to auscultation bilaterally, no wheezing, rales or rhonchi Abdomen: soft nontender, nondistended, normal bowel sounds, no organomegaly Extremities: no cyanosis, clubbing or edema noted bilaterally Neuro: Cranial nerves II-XII intact, no focal neurological deficits  Lab Results: Basic Metabolic Panel:  Lab 04/05/12 7846 04/04/12 0400  NA 135 132*  K 4.2 4.6  CL 102 97  CO2 19 16*  GLUCOSE 118* 106*  BUN 95* 119*  CREATININE 3.19* 4.89*  CALCIUM 8.3* 8.1*  MG -- --  PHOS -- --   Liver Function Tests:  Lab 04/05/12 1251  AST 62*  ALT 37  ALKPHOS 290*  BILITOT 0.8  PROT 6.1  ALBUMIN 2.2*   No results found for this basename: LIPASE:2,AMYLASE:2 in the last 168 hours No results found for this basename: AMMONIA:2 in the last 168 hours CBC:  Lab 04/06/12 0227 04/04/12 0400 04/03/12 1550  WBC 22.1* 44.6* --  NEUTROABS -- -- 59.5*  HGB 12.0* 12.1* --  HCT 33.9* 34.3* --  MCV  81.5 83.5 --  PLT 92* 99* --   Cardiac Enzymes:  Lab 04/06/12 0227 04/05/12 2002 04/05/12 1251  CKTOTAL -- -- --  CKMB -- -- --  CKMBINDEX -- -- --  TROPONINI 0.41*  <0.30 <0.30   BNP: No components found with this basename: POCBNP:2 CBG:  Lab 04/05/12 2257  GLUCAP 80     Micro Results: Recent Results (from the past 240 hour(s))  URINE CULTURE     Status: Normal   Collection Time   04/03/12  6:15 PM      Component Value Range Status Comment   Specimen Description URINE, CLEAN CATCH   Final    Special Requests NONE   Final    Culture  Setup Time 04/04/2012 01:22   Final    Colony Count >=100,000 COLONIES/ML   Final    Culture ESCHERICHIA COLI   Final    Report Status 04/05/2012 FINAL   Final    Organism ID, Bacteria ESCHERICHIA COLI   Final   URINE CULTURE     Status: Normal   Collection Time   04/04/12  1:24 AM      Component Value Range Status Comment   Specimen Description URINE, RANDOM   Final    Special Requests NONE   Final    Culture  Setup Time 04/04/2012 02:38   Final    Colony Count 60,000 COLONIES/ML   Final    Culture ESCHERICHIA COLI   Final    Report Status 04/05/2012 FINAL   Final    Organism ID, Bacteria ESCHERICHIA COLI   Final     Studies/Results: Dg Chest 2 View  04/03/2012  *RADIOLOGY REPORT*  Clinical Data: Cough.  Fever.  Shortness of breath.  Generalized body aches.  Diarrhea.  Indwelling pacemaker.  CHEST - 2 VIEW  Comparison: Two-view chest x-ray 08/04/2007, 06/22/1006, 05/26/2004, 05/22/2003.  Findings: Cardiomediastinal silhouette unremarkable, unchanged. Left subclavian dual lead transvenous pacemaker unchanged and appears intact.  Scarring in the lower lobes, unchanged.  Lungs otherwise clear.  Stable mild hyperinflation.  No visible pleural effusions.  Degenerative changes involving the thoracic spine.  No significant interval change.  IMPRESSION: Stable mild hyperinflation consistent with COPD and/or asthma. Stable scarring in the lower lobes.  No acute cardiopulmonary disease.   Original Report Authenticated By: Hulan Saas, M.D.    US Renal  04/04/2012  *RADIOLOGY REPORT*  Clinical Data: Acute renal  failure.  RENAL/URINARY TRACT ULTRASOUND COMPLETE  Comparison:  None.  Findings:  Right Kidney:  14.0 cm.  No hydronephrosis.  Three right renal cysts largest measuring up to 3.4 cm.  Left Kidney:  13.8 cm.  No hydronephrosis. 1.4 cm left upper pole cyst.  The lower pole 2.3 cm rounded structure may be a slightly complex cyst but cannot be confirmed as a simple cyst.  Bladder:  Foley catheter in place and decompressed.  Impression:  No evidence hydronephrosis.  Bilateral renal cysts.  Left lower pole 2.3 cm structure may represent a slightly complex cyst.  This cannot be confirmed as a simple cyst on the present exam.   Original Report Authenticated By: Lacy Duverney, M.D.    Dg Chest Port 1 View  04/06/2012  *RADIOLOGY REPORT*  Clinical Data: Fever  PORTABLE CHEST - 1 VIEW  Comparison: Chest radiograph 01/25 1014  Findings: Left-sided pacemaker overlies stable cardiac silhouette. There is an ill-defined 2.5 cm opacity in the left mid lung.  There is a trace right pleural effusion.  No pulmonary edema.  No pneumothorax.  No aggressive osseous lesions.  IMPRESSION:  Left lung opacity is incompletely characterized.  This could represent a focus of neoplasm or infection.    Recommend CT thorax with contrast for further evaluation.  This was made a call report.   Original Report Authenticated By: Genevive Bi, M.D.     Medications: Scheduled Meds:   . amitriptyline  50 mg Oral QHS  . antiseptic oral rinse  15 mL Mouth Rinse q12n4p  . atenolol  25 mg Oral Daily  . chlorhexidine  15 mL Mouth Rinse BID  . enoxaparin (LOVENOX) injection  30 mg Subcutaneous Q24H  . levofloxacin (LEVAQUIN) IV  500 mg Intravenous Q48H  . levothyroxine  100 mcg Oral QAC breakfast  . lidocaine   Urethral Once  . sodium chloride  3 mL Intravenous Q12H      LOS: 3 days   Nakeisha Greenhouse M.D. [facility] 04/06/2012, 9:48 AM Pager: [phone]  If 7PM-7AM, please contact night-coverage [email or web address] Password  [email or web address]

## 2012-04-06 NOTE — Significant Event (Signed)
Rapid Response Event Note  Overview: Time Called: 2241 Arrival Time: 2245 Event Type: Respiratory  Initial Focused Assessment: Called by bedside RN regarding patient with new rigors, complaining of 'not feeling well' and increased O2 needs. Upon arrival to bedside, patient alert BP=125/98, temp=102.1(rectal) unable to obtain reliable O2 sat d/t extremeshivering/rigors, HR=100 RR 30s, Lung sounds diminished but clear bilaterally, nonproductive cough on 100% NRB, NP paged   Interventions: PR Tyelnol ordered by NP and administered by bedside RN, Patient adjusted in bed and appropriate blankets were applied. Orders also received for [facility] X 2, will continue to monitor, advised bedside RN to call if needed  Event Summary: Name of Physician Notified: Junious Silk, NP at 2245    at    Outcome: Stayed in room and stabalized  Event End Time: 2320  Burna Forts, Isidoro Donning

## 2012-04-07 DIAGNOSIS — K219 Gastro-esophageal reflux disease without esophagitis: Secondary | ICD-10-CM

## 2012-04-07 DIAGNOSIS — N390 Urinary tract infection, site not specified: Secondary | ICD-10-CM

## 2012-04-07 DIAGNOSIS — R911 Solitary pulmonary nodule: Secondary | ICD-10-CM

## 2012-04-07 LAB — BASIC METABOLIC PANEL
BUN: 62 mg/dL — ABNORMAL HIGH (ref 6–23)
Chloride: 106 mEq/L (ref 96–112)
GFR calc Af Amer: 35 mL/min — ABNORMAL LOW (ref >90)
GFR calc non Af Amer: 30 mL/min — ABNORMAL LOW (ref >90)
Glucose, Bld: 140 mg/dL — ABNORMAL HIGH (ref 70–99)
Potassium: 4.1 mEq/L (ref 3.5–5.1)
Sodium: 139 mEq/L (ref 135–145)

## 2012-04-07 LAB — CBC
HCT: 35.4 % — ABNORMAL LOW (ref 39.0–52.0)
Hemoglobin: 11.8 g/dL — ABNORMAL LOW (ref 13.0–17.0)
MCHC: 33.3 g/dL (ref 30.0–36.0)
WBC: 16.6 10*3/uL — ABNORMAL HIGH (ref 4.0–10.5)

## 2012-04-07 MED ORDER — OXYCODONE-ACETAMINOPHEN 5-325 MG PO TABS
1.0000 | ORAL_TABLET | Freq: Four times a day (QID) | ORAL | Status: DC | PRN
  Administered 2012-04-07 – 2012-04-09 (×3): 1 via ORAL
  Filled 2012-04-07 (×3): qty 1

## 2012-04-07 MED ORDER — ACETAMINOPHEN 325 MG PO TABS
650.0000 mg | ORAL_TABLET | ORAL | Status: DC | PRN
  Administered 2012-04-07: 650 mg via ORAL

## 2012-04-07 MED ORDER — ENOXAPARIN SODIUM 30 MG/0.3ML ~~LOC~~ SOLN
30.0000 mg | SUBCUTANEOUS | Status: DC
  Filled 2012-04-07 (×2): qty 0.3

## 2012-04-07 MED ORDER — ZOLPIDEM TARTRATE 5 MG PO TABS
5.0000 mg | ORAL_TABLET | Freq: Every day | ORAL | Status: DC
  Administered 2012-04-07 – 2012-04-08 (×2): 5 mg via ORAL
  Filled 2012-04-07 (×2): qty 1

## 2012-04-07 NOTE — Progress Notes (Addendum)
Patient ID: Scott [name]  male  JXB:[id number]    DOB: [birth year]    DOA: 04/03/2012  PCP: Nadean Corwin, MD  Addendum Left lung nodule:  I discussed with the patient in detail regarding 2.7cm spiculated LUL nodule possibility of malignancy (former smoker). Patient wants to have biopsy done for tissue diagnosis and further management. Relayed to pulmonology, Dr Blima Dessert, recommended to keep NPO after MN for possible lung bx/bronch. I will hold lovenox dose.     Scott [name] M.D. [facility] 04/07/2012, 4:08 PM  Pager: [phone]     Assessment/Plan:  1. SIRS/rigors/ ecoli Urinary tract infection: due to urinary retention, may need to DC home with foley, continue levofloxacin, urine culture shows Escherichia coli sensitive to levofloxacin. Renal ultrasound shows no hydronephrosis or renal abscess. Blood cultures obtained last night, will follow results. 2. Leukocytosis likely leukemoid reaction versus sepsis, improving after started on antibiotics. Hematology following 3. Hypotension:improving, due to hypovolemia, sepsis vs atenolol. DC'D atenolol, continue IV antibiotics and fluid hydration.  4. Throat cancer stable 5. Thrombocytopenia secondary to his underlying infection, SIRS: IMPROVING. Per hematology, blood smear is reassuring and no underlying hematology malignancy identified.  6. Elevated troponin: Patient denies any chest pain or shortness of breath, 3 sets of troponin Negative, likely demand ischemia sec to SIRS.  7. Abnormal chest x-ray: Chest x-ray obtained this morning shows left mid lung opacity incompletely characterized 2.5 cm. CT chest shows 2.7cm spiculated LUL nodule suspicious for malignancy, will need tissue diagnosis. Will d/w patient how to proceed. D/W pulmonology, can set up in-patient vs out-patient biopsy if patient wants to pursue management.  8. Acute renal failure: Possibly secondary to dehydration, urinary retention and UTI. Renal ultrasound  showed no hydronephrosis or renal abscess. Avoid hypotension or any nephrotoxic drugs/ contrast. follow Cr function- IMPROVING   DVT Prophylaxis: SCDs  Code Status:  Disposition:    Subjective: Overall better, states can't sleep, mild pleuritic CP on right  Objective: Weight change:   Intake/Output Summary (Last 24 hours) at 04/07/12 1530 Last data filed at 04/07/12 1114  Gross per 24 hour  Intake   1460 ml  Output   2225 ml  Net   -765 ml   Blood pressure 116/59, pulse 112, temperature 98.2 F (36.8 C), temperature source Oral, resp. rate 18, height 6' (1.829 m), weight 96.435 kg (212 lb 9.6 oz), SpO2 98.00%.  Physical Exam: General: AxO X3, NAD HEENT: anicteric sclera, pupils reactive to light and accommodation, EOMI CVS: S1-S2 clear, no murmur rubs or gallops Chest: clear to auscultation bilaterally, no wheezing, rales or rhonchi Abdomen: soft, nt, nd, nbs Extremities: no cyanosis, clubbing or edema noted bilaterally   Lab Results: Basic Metabolic Panel:  Lab 04/07/12 8469 04/06/12 1035  NA 139 138  K 4.1 4.6  CL 106 104  CO2 22 20  GLUCOSE 140* 99  BUN 62* 85*  CREATININE 1.93* 2.70*  CALCIUM 7.9* 7.9*  MG -- --  PHOS -- --   Liver Function Tests:  Lab 04/05/12 1251  AST 62*  ALT 37  ALKPHOS 290*  BILITOT 0.8  PROT 6.1  ALBUMIN 2.2*   No results found for this basename: LIPASE:2,AMYLASE:2 in the last 168 hours No results found for this basename: AMMONIA:2 in the last 168 hours CBC:  Lab 04/07/12 0750 04/06/12 0227 04/03/12 1550  WBC 16.6* 22.1* --  NEUTROABS -- -- 59.5*  HGB 11.8* 12.0* --  HCT 35.4* 33.9* --  MCV 84.1 81.5 --  PLT 103* 92* --  Cardiac Enzymes:  Lab 04/06/12 2112 04/06/12 1529 04/06/12 1035  CKTOTAL 34 40 44  CKMB 4.1* 4.4* 4.4*  CKMBINDEX -- -- --  TROPONINI <0.30 <0.30 0.30*   BNP: No components found with this basename: POCBNP:2 CBG:  Lab 04/05/12 2257  GLUCAP 80     Micro Results: Recent Results (from  the past 240 hour(s))  URINE CULTURE     Status: Normal   Collection Time   04/03/12  6:15 PM      Component Value Range Status Comment   Specimen Description URINE, CLEAN CATCH   Final    Special Requests NONE   Final    Culture  Setup Time 04/04/2012 01:22   Final    Colony Count >=100,000 COLONIES/ML   Final    Culture ESCHERICHIA COLI   Final    Report Status 04/05/2012 FINAL   Final    Organism ID, Bacteria ESCHERICHIA COLI   Final   URINE CULTURE     Status: Normal   Collection Time   04/04/12  1:24 AM      Component Value Range Status Comment   Specimen Description URINE, RANDOM   Final    Special Requests NONE   Final    Culture  Setup Time 04/04/2012 02:38   Final    Colony Count 60,000 COLONIES/ML   Final    Culture ESCHERICHIA COLI   Final    Report Status 04/05/2012 FINAL   Final    Organism ID, Bacteria ESCHERICHIA COLI   Final   CULTURE, BLOOD (ROUTINE X 2)     Status: Normal (Preliminary result)   Collection Time   04/05/12 11:21 PM      Component Value Range Status Comment   Specimen Description BLOOD RIGHT HAND   Final    Special Requests BOTTLES DRAWN AEROBIC ONLY 10CC   Final    Culture  Setup Time 04/06/2012 04:35   Final    Culture     Final    Value:        BLOOD CULTURE RECEIVED NO GROWTH TO DATE CULTURE WILL BE HELD FOR 5 DAYS BEFORE ISSUING A FINAL NEGATIVE REPORT   Report Status PENDING   Incomplete   CULTURE, BLOOD (ROUTINE X 2)     Status: Normal (Preliminary result)   Collection Time   04/05/12 11:21 PM      Component Value Range Status Comment   Specimen Description BLOOD RIGHT ARM   Final    Special Requests BOTTLES DRAWN AEROBIC ONLY 10CC   Final    Culture  Setup Time 04/06/2012 04:35   Final    Culture     Final    Value:        BLOOD CULTURE RECEIVED NO GROWTH TO DATE CULTURE WILL BE HELD FOR 5 DAYS BEFORE ISSUING A FINAL NEGATIVE REPORT   Report Status PENDING   Incomplete     Studies/Results: Dg Chest 2 View  04/03/2012  *RADIOLOGY  REPORT*  Clinical Data: Cough.  Fever.  Shortness of breath.  Generalized body aches.  Diarrhea.  Indwelling pacemaker.  CHEST - 2 VIEW  Comparison: Two-view chest x-ray 08/04/2007, 06/22/1006, 05/26/2004, 05/22/2003.  Findings: Cardiomediastinal silhouette unremarkable, unchanged. Left subclavian dual lead transvenous pacemaker unchanged and appears intact.  Scarring in the lower lobes, unchanged.  Lungs otherwise clear.  Stable mild hyperinflation.  No visible pleural effusions.  Degenerative changes involving the thoracic spine.  No significant interval change.  IMPRESSION: Stable mild hyperinflation consistent with COPD  and/or asthma. Stable scarring in the lower lobes.  No acute cardiopulmonary disease.   Original Report Authenticated By: Hulan Saas, M.D.    US Renal  04/04/2012  *RADIOLOGY REPORT*  Clinical Data: Acute renal failure.  RENAL/URINARY TRACT ULTRASOUND COMPLETE  Comparison:  None.  Findings:  Right Kidney:  14.0 cm.  No hydronephrosis.  Three right renal cysts largest measuring up to 3.4 cm.  Left Kidney:  13.8 cm.  No hydronephrosis. 1.4 cm left upper pole cyst.  The lower pole 2.3 cm rounded structure may be a slightly complex cyst but cannot be confirmed as a simple cyst.  Bladder:  Foley catheter in place and decompressed.  Impression:  No evidence hydronephrosis.  Bilateral renal cysts.  Left lower pole 2.3 cm structure may represent a slightly complex cyst.  This cannot be confirmed as a simple cyst on the present exam.   Original Report Authenticated By: Lacy Duverney, M.D.    Dg Chest Port 1 View  04/06/2012  *RADIOLOGY REPORT*  Clinical Data: Fever  PORTABLE CHEST - 1 VIEW  Comparison: Chest radiograph 01/25 1014  Findings: Left-sided pacemaker overlies stable cardiac silhouette. There is an ill-defined 2.5 cm opacity in the left mid lung.  There is a trace right pleural effusion.  No pulmonary edema.  No pneumothorax.  No aggressive osseous lesions.  IMPRESSION:  Left lung  opacity is incompletely characterized.  This could represent a focus of neoplasm or infection.    Recommend CT thorax with contrast for further evaluation.  This was made a call report.   Original Report Authenticated By: Genevive Bi, M.D.     Medications: Scheduled Meds:    . amitriptyline  50 mg Oral QHS  . antiseptic oral rinse  15 mL Mouth Rinse q12n4p  . chlorhexidine  15 mL Mouth Rinse BID  . enoxaparin (LOVENOX) injection  30 mg Subcutaneous Q24H  . levofloxacin (LEVAQUIN) IV  750 mg Intravenous Q48H  . levothyroxine  100 mcg Oral QAC breakfast  . lidocaine   Urethral Once  . sodium chloride  3 mL Intravenous Q12H  . zolpidem  5 mg Oral QHS      LOS: 4 days   Saeed Toren M.D. [facility] 04/07/2012, 3:30 PM Pager: [phone]  If 7PM-7AM, please contact night-coverage [email or web address] Password [email or web address]

## 2012-04-07 NOTE — Consult Note (Signed)
PULMONARY  / CRITICAL CARE MEDICINE  Name: Scott [name] MRN: [id number] DOB: 10/08/1925    ADMISSION DATE:  04/03/2012 CONSULTATION DATE:  04/07/2012  REFERRING MD :  Maryan Puls  CHIEF COMPLAINT:  Lung nodule  BRIEF PATIENT DESCRIPTION: 79 y/o former smoker found to have a 2.7cm spiculated left lung nodule.  SIGNIFICANT EVENTS / STUDIES:  04/06/2012 CT chest >> 2.7 spiculated LUL nodule; lower lobe airway thickening and mucus plugging; left upper abdominal tissue density nodule of undetermined significance  LINES / TUBES:   CULTURES: 1/27 urine >> e. Coli 1/28 urine >> e. Coli 1/28 blood >> ngtd   ANTIBIOTICS: 1/26 levaquin >>   HISTORY OF PRESENT ILLNESS:  This is a very pleasant 79 y/o male with no known lung disease who was admitted on 04/03/2012 [facility] for fatigue, malaise, acute kidney injury, and profound leukocytosis do to a urinary tract infection. He has been treated with IV fluids and antibiotics and his kidney failure and leukocytosis are improving. He was found on chest imaging to have a left lung nodule which is spiculated and 2.7 cm in size. Pulmonary and critical care medicine was consulted to consider bronchoscopy. He tells me that in the last several months she's had no trouble breathing, no cough, no hemoptysis, no chest pain, and no weight loss and he is aware of. He says "his breathing is fine". He smoked less than one pack of cigarettes as well as cigars and pipes for 20 years and quit in 1984. He says that between 10 and 20 years ago he had a bronchoscopy when he was diagnosed with head and neck cancer. He was told that he did not have lung cancer and he just had "mucus clogging up one of his lungs". The details of his head and neck cancer diagnosis and treatment are not available but apparently this was managed more than 10 years ago.  PAST MEDICAL HISTORY :  Past Medical History  Diagnosis Date  . Prostate cancer   . Hypertension    . Cancer   . Thyroid disease     hypothyroidism  . Hyperlipidemia   . GERD (gastroesophageal reflux disease)    Past Surgical History  Procedure Date  . Pacemaker insertion     dul chamber St Jude 06/14/03  . Cataract extraction    Prior to Admission medications   Medication Sig Start Date End Date Taking? Authorizing Provider  amitriptyline (ELAVIL) 50 MG tablet Take 50 mg by mouth at bedtime.     Yes Historical Provider, MD  atenolol (TENORMIN) 50 MG tablet Take 50 mg by mouth daily.     Yes Historical Provider, MD  azelastine (ASTELIN) 137 MCG/SPRAY nasal spray Place 1 spray into the nose 2 (two) times daily. Use in each nostril as directed    Yes Historical Provider, MD  Cholecalciferol (VITAMIN D-3 PO) Take 4,000 Units by mouth.     Yes Historical Provider, MD  lansoprazole (PREVACID) 30 MG capsule Take 30 mg by mouth daily.    Yes Historical Provider, MD  levothyroxine (SYNTHROID, LEVOTHROID) 100 MCG tablet Take 100 mcg by mouth daily.     Yes Historical Provider, MD  pravastatin (PRAVACHOL) 40 MG tablet Take 40 mg by mouth daily.     Yes Historical Provider, MD  triamterene-hydrochlorothiazide (DYAZIDE) 37.5-25 MG per capsule Take 1 capsule by mouth every morning.     Yes Historical Provider, MD   Allergies  Allergen Reactions  . Penicillins  FAMILY HISTORY:  Family History  Problem Relation Age of Onset  . Heart disease Mother 79   SOCIAL HISTORY:  reports that he has quit smoking. He has quit using smokeless tobacco. He reports that he does not drink alcohol or use illicit drugs.  REVIEW OF SYSTEMS:   Gen: Denies fever, chills, weight change, fatigue, night sweats HEENT: Denies blurred vision, double vision, hearing loss, tinnitus, sinus congestion, rhinorrhea, sore throat, neck stiffness, dysphagia PULM: Denies shortness of breath, cough, sputum production, hemoptysis, wheezing CV: Denies chest pain, edema, orthopnea, paroxysmal nocturnal dyspnea,  palpitations GI: Denies abdominal pain, nausea, vomiting, diarrhea, hematochezia, melena, constipation, change in bowel habits; + poor appetite GU: Denies dysuria, hematuria, polyuria, oliguria, urethral discharge Endocrine: Denies hot or cold intolerance, polyuria, polyphagia or appetite change Derm: Denies rash, dry skin, scaling or peeling skin change Heme: Denies easy bruising, bleeding, bleeding gums Neuro: Denies headache, numbness, weakness, slurred speech, loss of memory or consciousness   SUBJECTIVE:   VITAL SIGNS: Temp:  [97.5 F (36.4 C)-98.2 F (36.8 C)] 98.2 F (36.8 C) (01/30 1745) Pulse Rate:  [68-112] 72  (01/30 1745) Resp:  [18-20] 18  (01/30 1745) BP: (97-122)/(57-64) 112/64 mmHg (01/30 1745) SpO2:  [98 %-99 %] 98 % (01/30 1745) Weight:  [96.435 kg (212 lb 9.6 oz)] 96.435 kg (212 lb 9.6 oz) (01/29 2023) HEMODYNAMICS:   VENTILATOR SETTINGS:   INTAKE / OUTPUT: Intake/Output      01/30 0701 - 01/31 0700   P.O.    I.V. (mL/kg)    Total Intake(mL/kg)    Urine (mL/kg/hr) 625 (0.5)   Total Output 625   Net -625         PHYSICAL EXAMINATION: Gen: no acute distress HEENT: NCAT, PERRL, EOMi, OP clear, neck supple without masses PULM: Few crackles RUL, otherwise CTA B CV: RRR, no mgr, no JVD AB: BS+, soft, nontender, no hsm Ext: warm, trace edema, no clubbing, no cyanosis Derm: no rash or skin breakdown Neuro: A&Ox4, CN II-XII intact, MAEW   LABS:  Lab 04/07/12 0750 04/06/12 2112 04/06/12 1529 04/06/12 1035 04/06/12 0227 04/05/12 1251 04/04/12 0400 04/03/12 2131  HGB 11.8* -- -- -- 12.0* -- 12.1* --  WBC 16.6* -- -- -- 22.1* -- 44.6* --  PLT 103* -- -- -- 92* -- 99* --  NA 139 -- -- 138 -- 135 -- --  K 4.1 -- -- 4.6 -- -- -- --  CL 106 -- -- 104 -- 102 -- --  CO2 22 -- -- 20 -- 19 -- --  GLUCOSE 140* -- -- 99 -- 118* -- --  BUN 62* -- -- 85* -- 95* -- --  CREATININE 1.93* -- -- 2.70* -- 3.19* -- --  CALCIUM 7.9* -- -- 7.9* -- 8.3* -- --  MG -- --  -- -- -- -- -- --  PHOS -- -- -- -- -- -- -- --  AST -- -- -- -- -- 62* -- --  ALT -- -- -- -- -- 37 -- --  ALKPHOS -- -- -- -- -- 290* -- --  BILITOT -- -- -- -- -- 0.8 -- --  PROT -- -- -- -- -- 6.1 -- --  ALBUMIN -- -- -- -- -- 2.2* -- --  APTT -- -- -- -- -- -- -- --  INR -- -- -- -- -- -- -- --  LATICACIDVEN -- -- -- -- -- -- -- 2.1  TROPONINI -- <0.30 <0.30 0.30* -- -- -- --  PROCALCITON -- -- -- -- -- -- -- --  PROBNP -- -- -- -- -- -- -- --  O2SATVEN -- -- -- -- -- -- -- --  PHART -- -- -- -- -- -- -- --  PCO2ART -- -- -- -- -- -- -- --  PO2ART -- -- -- -- -- -- -- --    Lab 04/05/12 2257  GLUCAP 80    1/29 CXR: LUL nodule 1/29 CT thorax >> 2.7 spiculated LUL nodule; lower lobe airway thickening and mucus plugging; left upper abdominal tissue density nodule of undetermined significance  ASSESSMENT / PLAN:  PULMONARY A: 1) Left lung nodule which is spiculated and 2.7 cm in size>> I explained to the patient that I'm very concerned that this is malignant nodule based on his prior smoking history and the imaging characteristics. It is very near the subcarina in the left upper lobe and should be amenable to bronchoscopy. P:   -Bronchoscopy with biopsy planned for 04/08/2012 at 2 PM -Hold Lovenox -Check PT/INR on January 31 AM -Check creatinine on January 31 AM and consider DDAVP if creatinine still markedly elevated -We will arrange outpatient followup after bronchoscopy  GASTROINTESTINAL A:   1) left upper abdominal nodular tissue density P:   -Per primary team  HEMATOLOGIC A:   1) Markedly leukocytosis due to infection, improving P:  -Continue antibiotics -Per hematology  INFECTIOUS A:   1) Urinary tract infection, improving P:   -Continue antibiotics per primary team  Fonnie Jarvis Pulmonary and Critical Care Medicine [facility] Pager: [phone]  04/07/2012, 7:30 PM

## 2012-04-07 NOTE — Progress Notes (Signed)
No last back yet today. His white cell  count yesterday was 22. This continues to improve with IV antibiotics and treatment of his Escherichia coli urinary tract infection.  There is still some from cytopenia which is mild. I still feel that this is related to his underlying infection. I do not see anything on his blood smear that looked suspicious for any hematologic issue. At 79 years old, his bone marrow is certainly sensitive and any "stress" could affect his platelets.  I noted the episode of his riders and temperature a couple days ago.  Currently his vital signs are stable. I don't see anything on his physical exam that is changed when I initially saw him.  We will continue to follow along as indicated. Again, I believe that his leukocytosis was related to his underlying infection. This should resolve as it is doing presently.  Pete E.  2 Cor 12:9-10

## 2012-04-08 ENCOUNTER — Encounter (HOSPITAL_COMMUNITY)
Admission: EM | Disposition: A | Discharge status: Home Health | Payer: Self-pay | Source: Home / Self Care | Attending: Internal Medicine

## 2012-04-08 ENCOUNTER — Telehealth: Payer: Self-pay | Admitting: Internal Medicine

## 2012-04-08 ENCOUNTER — Encounter (HOSPITAL_COMMUNITY): Payer: Medicare Other

## 2012-04-08 ENCOUNTER — Other Ambulatory Visit: Payer: Self-pay

## 2012-04-08 DIAGNOSIS — R0602 Shortness of breath: Secondary | ICD-10-CM

## 2012-04-08 DIAGNOSIS — R911 Solitary pulmonary nodule: Secondary | ICD-10-CM

## 2012-04-08 LAB — CBC
HCT: 37.7 % — ABNORMAL LOW (ref 39.0–52.0)
Hemoglobin: 12.8 g/dL — ABNORMAL LOW (ref 13.0–17.0)
MCH: 29 pg (ref 26.0–34.0)
RBC: 4.42 MIL/uL (ref 4.22–5.81)

## 2012-04-08 LAB — BASIC METABOLIC PANEL
BUN: 36 mg/dL — ABNORMAL HIGH (ref 6–23)
CO2: 25 mEq/L (ref 19–32)
Glucose, Bld: 106 mg/dL — ABNORMAL HIGH (ref 70–99)
Potassium: 4.2 mEq/L (ref 3.5–5.1)
Sodium: 139 mEq/L (ref 135–145)

## 2012-04-08 LAB — PROTIME-INR: INR: 1.21 (ref 0.00–1.49)

## 2012-04-08 SURGERY — BRONCHOSCOPY, WITH FLUOROSCOPY
Anesthesia: Moderate Sedation | Laterality: Bilateral

## 2012-04-08 MED ORDER — ENOXAPARIN SODIUM 40 MG/0.4ML ~~LOC~~ SOLN
40.0000 mg | SUBCUTANEOUS | Status: DC
  Administered 2012-04-09: 40 mg via SUBCUTANEOUS
  Filled 2012-04-08 (×2): qty 0.4

## 2012-04-08 MED ORDER — LEVOFLOXACIN 750 MG PO TABS
750.0000 mg | ORAL_TABLET | ORAL | Status: DC
  Filled 2012-04-08: qty 1

## 2012-04-08 MED ORDER — AZELASTINE HCL 0.1 % NA SOLN
1.0000 | Freq: Two times a day (BID) | NASAL | Status: DC
  Administered 2012-04-08 – 2012-04-09 (×3): 1 via NASAL
  Filled 2012-04-08: qty 30

## 2012-04-08 NOTE — Evaluation (Signed)
Physical Therapy Evaluation Patient Details Name: Scott [name] MRN: [id number] DOB: 05-Jun-1925 Today's Date: 04/08/2012 Time: 1324-4010 PT Time Calculation (min): 33 min  PT Assessment / Plan / Recommendation Clinical Impression  Patient is an 79 yo male admitted with renal failure and UTI.  Patient is independent with mobility/gait.  Mild deconditioning present.  Encouraged patient to ambulate in hallway with nursing.  No f/u PT needed.  PT will sign off.    PT Assessment  Patent does not need any further PT services    Follow Up Recommendations  Other (comment) (Patient refusing HHPT)    Does the patient have the potential to tolerate intense rehabilitation      Barriers to Discharge        Equipment Recommendations  None recommended by PT    Recommendations for Other Services     Frequency      Precautions / Restrictions Precautions Precautions: None Restrictions Weight Bearing Restrictions: No   Pertinent Vitals/Pain       Mobility  Bed Mobility Bed Mobility: Supine to Sit;Sitting - Scoot to Edge of Bed Supine to Sit: 6: Modified independent (Device/Increase time);With rails;HOB flat Sitting - Scoot to Edge of Bed: 5: Supervision Details for Bed Mobility Assistance: Verbal cues for safety.  Supervision for safety only.  No assist needed. Transfers Transfers: Sit to Stand;Stand to Sit Sit to Stand: 4: Min guard;With upper extremity assist;From bed Stand to Sit: 4: Min guard;With upper extremity assist;To bed Details for Transfer Assistance: Verbal cues for safety.  Assist for safety/balance only. Ambulation/Gait Ambulation/Gait Assistance: 4: Min guard Ambulation Distance (Feet): 200 Feet Assistive device: None Ambulation/Gait Assistance Details: Verbal cues to move at safe speed.  No assistive device used.  Balance good, with staggering x1.  Patient able to self-correct balance. Gait Pattern: Step-through pattern;Decreased stride length      PT Goals  N/A  Visit Information  Last PT Received On: 04/08/12 Assistance Needed: +1    Subjective Data  Subjective: "I have 2 children who can help me" Patient Stated Goal: To go home   Prior Functioning  Home Living Lives With: Alone Available Help at Discharge: Family;Available PRN/intermittently Type of Home: House Home Access: Stairs to enter;Level entry Home Layout: One level Bathroom Shower/Tub: Health visitor: Standard Home Adaptive Equipment: Straight cane Prior Function Level of Independence: Independent Able to Take Stairs?: Yes Driving: Yes Vocation: Retired Comments: Does house cleaning and yard work Musician: No difficulties    Cognition  Overall Cognitive Status: Appears within functional limits for tasks assessed/performed Arousal/Alertness: Awake/alert Orientation Level: Appears intact for tasks assessed Behavior During Session: [facility] for tasks performed    Extremity/Trunk Assessment Right Upper Extremity Assessment RUE ROM/Strength/Tone: WFL for tasks assessed RUE Sensation: WFL - Light Touch Left Upper Extremity Assessment LUE ROM/Strength/Tone: WFL for tasks assessed LUE Sensation: WFL - Light Touch Right Lower Extremity Assessment RLE ROM/Strength/Tone: WFL for tasks assessed RLE Sensation: WFL - Light Touch Left Lower Extremity Assessment LLE ROM/Strength/Tone: WFL for tasks assessed LLE Sensation: WFL - Light Touch   Balance Balance Balance Assessed: Yes High Level Balance High Level Balance Activites: Turns;Sudden stops;Head turns High Level Balance Comments: No loss of balance with high level balance activities.  End of Session PT - End of Session Equipment Utilized During Treatment: Gait belt Activity Tolerance: Patient tolerated treatment well Patient left: in bed;with call bell/phone within reach Nurse Communication: Mobility status  GP     Vena Austria 04/08/2012, 8:27 PM Durenda Hurt.  Renaldo Fiddler, [facility] Acute  Rehab Services Pager [phone]

## 2012-04-08 NOTE — Care Management Note (Signed)
   CARE MANAGEMENT NOTE 04/08/2012  Patient:  Scott [name], Scott [name]   Account Number:  [id number]  Date Initiated:  04/08/2012  Documentation initiated by:  Johny Shock  Subjective/Objective Assessment:   Order for [facility] and HHPT     Action/Plan:   Met with pt who politely declined all HH services stating that his daughter and son would provide any assistance needed.   Anticipated DC Date:  04/08/2012   Anticipated DC Plan:  HOME W HOME HEALTH SERVICES         Choice offered to / List presented to:             Status of service:  Completed, signed off Medicare Important Message given?   (If response is "NO", the following Medicare IM given date fields will be blank) Date Medicare IM given:   Date Additional Medicare IM given:    Discharge Disposition:  HOME/SELF CARE  Per UR Regulation:    If discussed at Long Length of Stay Meetings, dates discussed:    Comments:  04/08/2012 Met with pt and discussed d/c needs, pt declined all HH stating that his family would assist with all needs. This CM asked pt to contact or have his family contact his PCP if it is determined that he needs assistance with [facility] after d/c and request orders. Johny Shock RN MPH case Manager [phone]

## 2012-04-08 NOTE — Progress Notes (Signed)
  ANTIBIOTIC CONSULT NOTE - FOLLOW UP  Pharmacy Consult for Levaquin Indication: E. Coli UTI and r/o sepsis  Allergies  Allergen Reactions  . Penicillins    Patient Measurements: Height: 6' (182.9 cm) Weight: 207 lb 4.8 oz (94.031 kg) IBW/kg (Calculated) : 77.6   Vital Signs: Temp: 98 F (36.7 C) (01/31 1347) Temp src: Oral (01/31 1347) BP: 123/68 mmHg (01/31 1347) Pulse Rate: 84  (01/31 1347)  Labs:  Basename 04/08/12 1009 04/07/12 0750 04/06/12 1035 04/06/12 0227  WBC 17.6* 16.6* -- 22.1*  HGB 12.8* 11.8* -- 12.0*  PLT 183 103* -- 92*  LABCREA -- -- -- --  CREATININE 1.48* 1.93* 2.70* --   Estimated Creatinine Clearance: 44.2 ml/min (by C-G formula based on Cr of 1.48).  Assessment:  79 y.o. M who continues on Levaquin abx D#6 for E. Coli UTI and r/o sepsis. Patient noted to be in acute renal failure this admission with improving renal function however dose remains appropriate at this time. SCr 1.48, CrCl~35-40 ml/mi. Will continue to monitor renal function over the weekend for any needed dose adjustments.    Will change Levaquin from IV to po today based on the criteria approved by the P&T committee listed below:    Patient being treated for a respiratory tract infection, urinary tract infection, or cellulitis  The patient is not neutropenic and does not exhibit a GI malabsorption state  The patient is eating (either orally or via tube) and/or has been taking other orally administered medications for a least 24 hours  The patient is improving clinically and has a Tmax < 100.5  Goal of Therapy:  Proper antibiotics for infection/cultures adjusted for renal/hepatic function   Plan:  1. Change Levaquin to 750 mg po every 48 hours 2. Will continue to follow renal function, culture results, LOT, and antibiotic de-escalation plans   Georgina Pillion, PharmD, BCPS Clinical Pharmacist Pager: [phone] 04/08/2012 2:44 PM

## 2012-04-08 NOTE — Progress Notes (Signed)
Patient ID: Scott [name]  male  ZOX:[id number]    DOB: 1925/10/19    DOA: 04/03/2012  PCP: Nadean Corwin, MD    Assessment/Plan:  1. SIRS/rigors/ ecoli Urinary tract infection/pyelonephritis: due to urinary retention, may need to DC home with foley, continue levofloxacin, urine culture shows Escherichia coli sensitive to levofloxacin. Renal ultrasound showed no hydronephrosis or renal abscess. Blood cultures obtained 1/28: Negative so far. 2. Leukocytosis likely leukemoid reaction versus sepsis, improving after started on antibiotics. Hematology following 3. Hypotension: improving, due to hypovolemia, sepsis vs atenolol. DC'D atenolol, continue IV antibiotics and fluid hydration. Will DC IVF tomorrow, patient was NPO after MN for biopsy today (later cancelled)  4. Throat cancer stable 5. Thrombocytopenia secondary to his underlying infection, SIRS: IMPROVING. Per hematology, blood smear is reassuring and no underlying hematology malignancy identified.  6. Elevated troponin: Patient denies any chest pain or shortness of breath, 3 sets of troponin Negative, likely demand ischemia sec to SIRS.  7. LUL 2.7cm spiculated nodule: Chest x-ray on 1/29 showed left mid lung opacity incompletely characterized 2.5 cm. CT chest shows 2.7cm spiculated LUL nodule suspicious for malignancy, will need tissue diagnosis. Discussed with pulmonology, initially the biopsy was scheduled for 2 PM today. However, per Dr Marchelle Gearing (PCCM), given cardiac issues, recovering renal function, sepsis, it can be done out-patient with PET scan first. Resumed diet.   8. Acute renal failure: Improving (Cr 5.48 at admission) Possibly secondary to dehydration, urinary retention and UTI. Renal ultrasound showed no hydronephrosis or renal abscess. Avoid hypotension or any nephrotoxic drugs/ contrast.  9. Generalised debility: PT evaluation will be obtained, patient lives alone   DVT Prophylaxis: SCDs  Code  Status:  Disposition: Hopefully tomorrow, PT evaluation pending   Subjective:  Feels better after eating food, daughter at the bedside, slight coughing but overall significantly improving, sitting up in the chair  Objective: Weight change: -2.404 kg (-5 lb 4.8 oz)  Intake/Output Summary (Last 24 hours) at 04/08/12 1416 Last data filed at 04/08/12 1401  Gross per 24 hour  Intake 2958.75 ml  Output   2225 ml  Net 733.75 ml   Blood pressure 123/68, pulse 84, temperature 98 F (36.7 C), temperature source Oral, resp. rate 18, height 6' (1.829 m), weight 94.031 kg (207 lb 4.8 oz), SpO2 93.00%.  Physical Exam: General: AxO X3, NAD HEENT: PERLA, EOMI CVS: S1-S2 clear, no murmur rubs or gallops Chest: CTAB Abdomen: soft, nt, nd, nbs Extremities: no cyanosis, clubbing or edema noted bilaterally   Lab Results: Basic Metabolic Panel:  Lab 04/08/12 8119 04/07/12 0750  NA 139 139  K 4.2 4.1  CL 106 106  CO2 25 22  GLUCOSE 106* 140*  BUN 36* 62*  CREATININE 1.48* 1.93*  CALCIUM 8.1* 7.9*  MG -- --  PHOS -- --   Liver Function Tests:  Lab 04/05/12 1251  AST 62*  ALT 37  ALKPHOS 290*  BILITOT 0.8  PROT 6.1  ALBUMIN 2.2*     Lab 04/08/12 1009 04/07/12 0750 04/03/12 1550  WBC 17.6* 16.6* --  NEUTROABS -- -- 59.5*  HGB 12.8* 11.8* --  HCT 37.7* 35.4* --  MCV 85.3 84.1 --  PLT 183 103* --   Cardiac Enzymes:  Lab 04/06/12 2112 04/06/12 1529 04/06/12 1035  CKTOTAL 34 40 44  CKMB 4.1* 4.4* 4.4*  CKMBINDEX -- -- --  TROPONINI <0.30 <0.30 0.30*   BNP: No components found with this basename: POCBNP:2 CBG:  Lab 04/05/12 2257  GLUCAP 80  Micro Results: Recent Results (from the past 240 hour(s))  URINE CULTURE     Status: Normal   Collection Time   04/03/12  6:15 PM      Component Value Range Status Comment   Specimen Description URINE, CLEAN CATCH   Final    Special Requests NONE   Final    Culture  Setup Time 04/04/2012 01:22   Final    Colony Count  >=100,000 COLONIES/ML   Final    Culture ESCHERICHIA COLI   Final    Report Status 04/05/2012 FINAL   Final    Organism ID, Bacteria ESCHERICHIA COLI   Final   URINE CULTURE     Status: Normal   Collection Time   04/04/12  1:24 AM      Component Value Range Status Comment   Specimen Description URINE, RANDOM   Final    Special Requests NONE   Final    Culture  Setup Time 04/04/2012 02:38   Final    Colony Count 60,000 COLONIES/ML   Final    Culture ESCHERICHIA COLI   Final    Report Status 04/05/2012 FINAL   Final    Organism ID, Bacteria ESCHERICHIA COLI   Final   CULTURE, BLOOD (ROUTINE X 2)     Status: Normal (Preliminary result)   Collection Time   04/05/12 11:21 PM      Component Value Range Status Comment   Specimen Description BLOOD RIGHT HAND   Final    Special Requests BOTTLES DRAWN AEROBIC ONLY 10CC   Final    Culture  Setup Time 04/06/2012 04:35   Final    Culture     Final    Value:        BLOOD CULTURE RECEIVED NO GROWTH TO DATE CULTURE WILL BE HELD FOR 5 DAYS BEFORE ISSUING A FINAL NEGATIVE REPORT   Report Status PENDING   Incomplete   CULTURE, BLOOD (ROUTINE X 2)     Status: Normal (Preliminary result)   Collection Time   04/05/12 11:21 PM      Component Value Range Status Comment   Specimen Description BLOOD RIGHT ARM   Final    Special Requests BOTTLES DRAWN AEROBIC ONLY 10CC   Final    Culture  Setup Time 04/06/2012 04:35   Final    Culture     Final    Value:        BLOOD CULTURE RECEIVED NO GROWTH TO DATE CULTURE WILL BE HELD FOR 5 DAYS BEFORE ISSUING A FINAL NEGATIVE REPORT   Report Status PENDING   Incomplete     Studies/Results: Dg Chest 2 View  04/03/2012  *RADIOLOGY REPORT*  Clinical Data: Cough.  Fever.  Shortness of breath.  Generalized body aches.  Diarrhea.  Indwelling pacemaker.  CHEST - 2 VIEW  Comparison: Two-view chest x-ray 08/04/2007, 06/22/1006, 05/26/2004, 05/22/2003.  Findings: Cardiomediastinal silhouette unremarkable, unchanged. Left  subclavian dual lead transvenous pacemaker unchanged and appears intact.  Scarring in the lower lobes, unchanged.  Lungs otherwise clear.  Stable mild hyperinflation.  No visible pleural effusions.  Degenerative changes involving the thoracic spine.  No significant interval change.  IMPRESSION: Stable mild hyperinflation consistent with COPD and/or asthma. Stable scarring in the lower lobes.  No acute cardiopulmonary disease.   Original Report Authenticated By: Hulan Saas, M.D.    US Renal  04/04/2012  *RADIOLOGY REPORT*  Clinical Data: Acute renal failure.  RENAL/URINARY TRACT ULTRASOUND COMPLETE  Comparison:  None.  Findings:  Right Kidney:  14.0 cm.  No hydronephrosis.  Three right renal cysts largest measuring up to 3.4 cm.  Left Kidney:  13.8 cm.  No hydronephrosis. 1.4 cm left upper pole cyst.  The lower pole 2.3 cm rounded structure may be a slightly complex cyst but cannot be confirmed as a simple cyst.  Bladder:  Foley catheter in place and decompressed.  Impression:  No evidence hydronephrosis.  Bilateral renal cysts.  Left lower pole 2.3 cm structure may represent a slightly complex cyst.  This cannot be confirmed as a simple cyst on the present exam.   Original Report Authenticated By: Lacy Duverney, M.D.    Dg Chest Port 1 View  04/06/2012  *RADIOLOGY REPORT*  Clinical Data: Fever  PORTABLE CHEST - 1 VIEW  Comparison: Chest radiograph 01/25 1014  Findings: Left-sided pacemaker overlies stable cardiac silhouette. There is an ill-defined 2.5 cm opacity in the left mid lung.  There is a trace right pleural effusion.  No pulmonary edema.  No pneumothorax.  No aggressive osseous lesions.  IMPRESSION:  Left lung opacity is incompletely characterized.  This could represent a focus of neoplasm or infection.    Recommend CT thorax with contrast for further evaluation.  This was made a call report.   Original Report Authenticated By: Genevive Bi, M.D.     Medications: Scheduled Meds:    .  amitriptyline  50 mg Oral QHS  . antiseptic oral rinse  15 mL Mouth Rinse q12n4p  . azelastine  1 spray Each Nare BID  . chlorhexidine  15 mL Mouth Rinse BID  . enoxaparin (LOVENOX) injection  30 mg Subcutaneous Q24H  . levofloxacin (LEVAQUIN) IV  750 mg Intravenous Q48H  . levothyroxine  100 mcg Oral QAC breakfast  . lidocaine   Urethral Once  . sodium chloride  3 mL Intravenous Q12H  . zolpidem  5 mg Oral QHS      LOS: 5 days   Yehoshua Vitelli M.D. [facility] 04/08/2012, 2:16 PM Pager: [phone]  If 7PM-7AM, please contact night-coverage [email or web address] Password [email or web address]

## 2012-04-08 NOTE — Telephone Encounter (Signed)
Please give him first available followup appointment to see me for pulmonary nodule  He needs pulmonary function test before he sees me  He also needs PET scan before he sees me  This is one of those inpatient discharges but I cannot order those tests till he clears out out of the hospital

## 2012-04-08 NOTE — Progress Notes (Addendum)
Preprocedure assessment by pulmonary by operator Dr. Marchelle Gearing  Noted risk factors of age, cardiac issues, current ongoing renal failure although improving but current current creatinine of 2 mg percent and admission for Escherichia coli urinary tract infection and sepsis.  Based on all this and was unknown baseline lung function and unknown staging via PET scan and lack of emergent need for bronchoscopy I will currently cancel bronchoscopy. We work him up as an outpatient with PET scan pulmonary function test and then decide on bronchoscopy once renal function normalized   Have discussed this with triad hospitalist Dr. Isidoro Donning  Will explain this to patient face-to-face in the afternoon   Dr. Kalman Shan, M.D., [name].C.P Pulmonary and Critical Care Medicine Staff Physician [facility] [place] [facility] Pager: [phone], If no answer or between  15:00h - 7:00h: call [phone]  04/08/2012 9:17 AM      4:30 PM 2 hours earlier I explained all the above face-to-face with the patient I put a reminder in my calendar to call him and followup with him arrange for PET scan and pulmonary function test He and are agreeable with the plan and actually prefer to wait to have the biopsy  Dr. Kalman Shan, M.D., [facility].C.P Pulmonary and Critical Care Medicine Staff Physician [facility] [place] [facility]monary and Critical Care Pager: [phone], If no answer or between  15:00h - 7:00h: call [phone]  04/08/2012 4:30 PM    > 50% of this > 25 min visit spent in face to face counseling

## 2012-04-09 DIAGNOSIS — K5900 Constipation, unspecified: Secondary | ICD-10-CM

## 2012-04-09 DIAGNOSIS — R339 Retention of urine, unspecified: Secondary | ICD-10-CM | POA: Diagnosis present

## 2012-04-09 DIAGNOSIS — R911 Solitary pulmonary nodule: Secondary | ICD-10-CM | POA: Diagnosis present

## 2012-04-09 LAB — CBC
HCT: 35.5 % — ABNORMAL LOW (ref 39.0–52.0)
Hemoglobin: 12 g/dL — ABNORMAL LOW (ref 13.0–17.0)
MCV: 85.3 fL (ref 78.0–100.0)
RBC: 4.16 MIL/uL — ABNORMAL LOW (ref 4.22–5.81)
WBC: 19.6 10*3/uL — ABNORMAL HIGH (ref 4.0–10.5)

## 2012-04-09 LAB — BASIC METABOLIC PANEL
BUN: 28 mg/dL — ABNORMAL HIGH (ref 6–23)
CO2: 25 mEq/L (ref 19–32)
Chloride: 105 mEq/L (ref 96–112)
GFR calc Af Amer: 58 mL/min — ABNORMAL LOW (ref >90)
Glucose, Bld: 97 mg/dL (ref 70–99)
Potassium: 4 mEq/L (ref 3.5–5.1)

## 2012-04-09 MED ORDER — LEVOFLOXACIN 250 MG PO TABS
250.0000 mg | ORAL_TABLET | Freq: Every day | ORAL | Status: DC
  Administered 2012-04-09: 250 mg via ORAL
  Filled 2012-04-09 (×2): qty 1

## 2012-04-09 MED ORDER — LEVOFLOXACIN 250 MG PO TABS: 250.0000 mg | ORAL_TABLET | Freq: Every day | ORAL | Status: DC

## 2012-04-09 MED ORDER — GUAIFENESIN-DM 100-10 MG/5ML PO SYRP: 5.0000 mL | ORAL_SOLUTION | ORAL | Status: DC | PRN

## 2012-04-09 MED ORDER — TAMSULOSIN HCL 0.4 MG PO CAPS: 0.4000 mg | ORAL_CAPSULE | Freq: Every day | ORAL | Status: DC

## 2012-04-09 MED ORDER — TAMSULOSIN HCL 0.4 MG PO CAPS
0.4000 mg | ORAL_CAPSULE | Freq: Every day | ORAL | Status: DC
  Administered 2012-04-09: 0.4 mg via ORAL
  Filled 2012-04-09: qty 1

## 2012-04-09 MED ORDER — CYCLOBENZAPRINE HCL 7.5 MG PO TABS: 7.5000 mg | ORAL_TABLET | Freq: Three times a day (TID) | ORAL | Status: DC | PRN

## 2012-04-09 MED ORDER — LIDOCAINE 4 % EX CREA: TOPICAL_CREAM | Freq: Every day | CUTANEOUS | Status: DC | PRN

## 2012-04-09 MED ORDER — ZOLPIDEM TARTRATE 5 MG PO TABS: 5.0000 mg | ORAL_TABLET | Freq: Every evening | ORAL | Status: DC | PRN

## 2012-04-09 NOTE — Progress Notes (Signed)
Notified by day RN of abnormal qrs on telemetry strip. MD notified.

## 2012-04-09 NOTE — Progress Notes (Deleted)
Notified by day RN of third space pacing on telemetry. MD notified. Awaiting further orders.

## 2012-04-09 NOTE — Discharge Summary (Signed)
Physician Discharge Summary  Patient ID: Scott [name] MRN: [id number] DOB/AGE: Sep 30, 1925 79 y.o.  Admit date: 04/03/2012 Discharge date: 04/09/2012  Primary Care Physician:  Nadean Corwin, MD  Discharge Diagnoses:    SIRS (systemic inflammatory response syndrome)- resolved  . PPM-St.Jude . Atrioventricular block, complete . HYPERTENSION- had hypotension during inpatient stay  . THROAT CANCER . Acute renal failure- resolved  . Leukocytosis severe over 60 . Elevated troponin .  Ecoli UTI (urinary tract infection) . Nodule of left lung- 2.7 cm spiculated . Pyelonephritis . Urinary retention  Consults:  Hematology, Dr Myna Hidalgo                    Pulmonology, Dr. Marchelle Gearing   Discharge Medications:   Medication List     As of 04/09/2012 10:32 AM    STOP taking these medications         atenolol 50 MG tablet   Commonly known as: TENORMIN      triamterene-hydrochlorothiazide 37.5-25 MG per capsule   Commonly known as: DYAZIDE      TAKE these medications         amitriptyline 50 MG tablet   Commonly known as: ELAVIL   Take 50 mg by mouth at bedtime.      ASTELIN 137 MCG/SPRAY nasal spray   Generic drug: azelastine   Place 1 spray into the nose 2 (two) times daily. Use in each nostril as directed      cyclobenzaprine 7.5 MG tablet   Commonly known as: FEXMID   Take 1 tablet (7.5 mg total) by mouth 3 (three) times daily as needed for muscle spasms.      guaiFENesin-dextromethorphan 100-10 MG/5ML syrup   Commonly known as: ROBITUSSIN DM   Take 5 mLs by mouth every 4 (four) hours as needed for cough.      lansoprazole 30 MG capsule   Commonly known as: PREVACID   Take 30 mg by mouth daily.      levofloxacin 250 MG tablet   Commonly known as: LEVAQUIN   Take 1 tablet (250 mg total) by mouth daily. For 7 more days, then stop      levothyroxine 100 MCG tablet   Commonly known as: SYNTHROID, LEVOTHROID   Take 100 mcg by mouth daily.      lidocaine 4 %  cream   Commonly known as: LMX   Apply topically daily as needed (pain).      pravastatin 40 MG tablet   Commonly known as: PRAVACHOL   Take 40 mg by mouth daily.      VITAMIN D-3 PO   Take 4,000 Units by mouth.      zolpidem 5 MG tablet   Commonly known as: AMBIEN   Take 1 tablet (5 mg total) by mouth at bedtime as needed for sleep.         Brief H and P: For complete details please refer to admission H and P, but in brief patient is 79 year old male with history of heart block status post pacemaker, prostate cancer, throat cancer presented with 10 days of cough congestion which over the last year or so had fevers and chills. Patient had not been eating very well, has not been urinating as much. He was found to be in acute renal failure with creatinine of 5.8 at the time of admission and significant leukocytosis over 60.   Hospital Course:  Patient is 79 year old male with history of throat cancer, prostate cancer, hypothyroidism and hyperlipidemia was  admitted with acute renal failure, URI symptoms, SIRS, leukocytosis >60K. U. at the time of admission showed UTI in cultures grew Escherichia coli. 1. SIRS/rigors/ ecoli Urinary tract infection/pyelonephritis: due to urinary retention, continue levofloxacin, urine culture shows Escherichia coli sensitive to levofloxacin. Renal ultrasound showed no hydronephrosis or renal abscess. Blood cultures obtained 1/28: Negative so far. Levofloxacin is changed to oral and continue for 7 more days to complete the course of 14 days. Foley catheter was removed at the time of discharge for voiding trial. 2. Leukocytosis likely leukemoid reaction versus due to SIRS, improved after started on antibiotics. Hematology was consulted and patient was followed by Dr. Myna Hidalgo, did not feel there was any hematological malignancy. Follow CBC out-patient in 1 week.   3. Hypotension: improved likely due to hypovolemia, sepsis vs atenolol. All her antihypertensives  were discontinued, patient was provided with fluid hydration 4. Throat cancer stable 5. Thrombocytopenia secondary to his underlying infection: Improved, per hematology, blood smear is reassuring and no underlying hematology malignancy identified.  6. Elevated troponin: During initial hospitalization and rigors, one set of troponin was positive at 0.41. Patient denied any chest pain or shortness of breath, 3 sets of troponin after that negative, likely demand ischemia sec to SIRS.  7. LUL 2.7cm spiculated nodule: Chest x-ray on 1/29 showed left mid lung opacity incompletely characterized 2.5 cm. CT chest shows 2.7cm spiculated LUL nodule suspicious for malignancy, will need tissue diagnosis. Discussed with pulmonology, per Dr Marchelle Gearing (PCCM), given cardiac issues, recovering renal function, sepsis, it can be done out-patient with PET scan first. Dr Marchelle Gearing will make arrangements for PET scan and PFT's prior to the biopsy. 8. Acute renal failure: Improving (Cr 5.48 at admission) Possibly secondary to dehydration, urinary retention and UTI. Renal ultrasound showed no hydronephrosis or renal abscess. Avoid hypotension or any nephrotoxic drugs/ contrast. Cr has normalized at 1.27 at the time of discharge. 9. Generalised debility: PT evaluation was obtained however patient declined any home physical therapy.  Day of Discharge BP 138/72  Pulse 88  Temp 98 F (36.7 C) (Oral)  Resp 20  Ht 6' (1.829 m)  Wt 95.391 kg (210 lb 4.8 oz)  BMI 28.52 kg/m2  SpO2 98%  Physical Exam: General: Alert and awake oriented x3 not in any acute distress. HEENT: anicteric sclera, pupils reactive to light and accommodation CVS: S1-S2 clear no murmur rubs or gallops Chest: clear to auscultation bilaterally, no wheezing rales or rhonchi Abdomen: soft nontender, nondistended, normal bowel sounds, no organomegaly Extremities: no cyanosis, clubbing or edema noted bilaterally Neuro: Cranial nerves II-XII intact, no focal  neurological deficits   The results of significant diagnostics from this hospitalization (including imaging, microbiology, ancillary and laboratory) are listed below for reference.    LAB RESULTS: Basic Metabolic Panel:  Lab 04/09/12 2130 04/08/12 1009  NA 137 139  K 4.0 4.2  CL 105 106  CO2 25 25  GLUCOSE 97 106*  BUN 28* 36*  CREATININE 1.27 1.48*  CALCIUM 7.5* 8.1*  MG -- --  PHOS -- --   Liver Function Tests:  Lab 04/05/12 1251  AST 62*  ALT 37  ALKPHOS 290*  BILITOT 0.8  PROT 6.1  ALBUMIN 2.2*   CBC:  Lab 04/09/12 0530 04/08/12 1009 04/03/12 1550  WBC 19.6* 17.6* --  NEUTROABS -- -- 59.5*  HGB 12.0* 12.8* --  HCT 35.5* 37.7* --  MCV 85.3 -- --  PLT 224 183 --   Cardiac Enzymes:  Lab 04/06/12 2112 04/06/12 1529  CKTOTAL 34 40  CKMB 4.1* 4.4*  CKMBINDEX -- --  TROPONINI <0.30 <0.30   CBG:  Lab 04/05/12 2257  GLUCAP 80    Significant Diagnostic Studies:  Dg Chest 2 View  04/03/2012  *RADIOLOGY REPORT*  Clinical Data: Cough.  Fever.  Shortness of breath.  Generalized body aches.  Diarrhea.  Indwelling pacemaker.  CHEST - 2 VIEW  Comparison: Two-view chest x-ray 08/04/2007, 06/22/1006, 05/26/2004, 05/22/2003.  Findings: Cardiomediastinal silhouette unremarkable, unchanged. Left subclavian dual lead transvenous pacemaker unchanged and appears intact.  Scarring in the lower lobes, unchanged.  Lungs otherwise clear.  Stable mild hyperinflation.  No visible pleural effusions.  Degenerative changes involving the thoracic spine.  No significant interval change.  IMPRESSION: Stable mild hyperinflation consistent with COPD and/or asthma. Stable scarring in the lower lobes.  No acute cardiopulmonary disease.   Original Report Authenticated By: Hulan Saas, M.D.    US Renal  04/04/2012  *RADIOLOGY REPORT*  Clinical Data: Acute renal failure.  RENAL/URINARY TRACT ULTRASOUND COMPLETE  Comparison:  None.  Findings:  Right Kidney:  14.0 cm.  No hydronephrosis.  Three  right renal cysts largest measuring up to 3.4 cm.  Left Kidney:  13.8 cm.  No hydronephrosis. 1.4 cm left upper pole cyst.  The lower pole 2.3 cm rounded structure may be a slightly complex cyst but cannot be confirmed as a simple cyst.  Bladder:  Foley catheter in place and decompressed.  Impression:  No evidence hydronephrosis.  Bilateral renal cysts.  Left lower pole 2.3 cm structure may represent a slightly complex cyst.  This cannot be confirmed as a simple cyst on the present exam.   Original Report Authenticated By: Lacy Duverney, M.D.      Disposition and Follow-up:     Discharge Orders    Future Orders Please Complete By Expires   Diet - low sodium heart healthy      Increase activity slowly      Discharge instructions      Comments:   1) Please call Dr Marchelle Gearing (Pulmonary office) on Monday for first available appointment. 2) Stop your blood pressure medications.       DISPOSITION: Home DIET: Heart healthy ACTIVITY: As tolerated TESTS THAT NEED FOLLOW-UP CBC AND BMET in 1 week  DISCHARGE FOLLOW-UP Follow-up Information    Follow up with Nadean Corwin, MD. Schedule an appointment as soon as possible for a visit in 10 days. (for hospital follow-up)       Follow up with [facility], MD. Schedule an appointment as soon as possible for a visit in 10 days. (please call the office on Monday for your appt date and time)    Contact information:   [address] [place] [address] [phone]          Time spent on Discharge: 45 mins  Signed:   RAI,RIPUDEEP M.D. [facility] 04/09/2012, 10:32 AM Pager: [phone]

## 2012-04-09 NOTE — Progress Notes (Signed)
Physical Therapy Treatment Patient Details Name: Scott [name] MRN: [id number] DOB: 1926-02-08 Today's Date: 04/09/2012 Time: 4098-1191 PT Time Calculation (min): 27 min  PT Assessment / Plan / Recommendation Comments on Treatment Session  Received call from RN stating patient having increased difficulty with mobility today.  Re-evaluated patient for discharge needs.  Patient was more fatigued today, decreasing balance during gait.  Patient agreeable to HHPT today.  Recommend HHPT and RW for discharge.   Patient to be discharged home today.    Follow Up Recommendations  Home health PT;Supervision/Assistance - 24 hour     Does the patient have the potential to tolerate intense rehabilitation     Barriers to Discharge        Equipment Recommendations  Rolling walker with 5" wheels    Recommendations for Other Services    Frequency     Plan Discharge plan needs to be updated;Equipment recommendations need to be updated    Precautions / Restrictions Precautions Precautions: None Restrictions Weight Bearing Restrictions: No   Pertinent Vitals/Pain     Mobility  Bed Mobility Bed Mobility: Supine to Sit;Sitting - Scoot to Edge of Bed Supine to Sit: 6: Modified independent (Device/Increase time);With rails;HOB flat Sitting - Scoot to Edge of Bed: 5: Supervision Details for Bed Mobility Assistance: Verbal cues for safety.  Supervision for safety only.  No assist needed. Transfers Transfers: Sit to Stand;Stand to Sit Sit to Stand: 4: Min assist;With upper extremity assist;From bed Stand to Sit: 4: Min guard;With upper extremity assist;To bed Details for Transfer Assistance: Verbal cues for safety and hand placement.  Patient using rocking motion today to come to standing - min assist to maintain balance. Ambulation/Gait Ambulation/Gait Assistance: 4: Min assist Ambulation Distance (Feet): 200 Feet Assistive device: None Ambulation/Gait Assistance Details: Patient with somewhat  decreased balance today with gait.  Reports feeling fatigued and having more difficulty with gait today. Gait Pattern: Step-through pattern;Decreased stride length (Staggering at times)      Visit Information  Last PT Received On: 04/09/12 Assistance Needed: +1    Subjective Data  Subjective: Daughter concerned about patient's mobility at home.  Patient states he can't get up to chair by himself   Cognition  Overall Cognitive Status: Appears within functional limits for tasks assessed/performed Arousal/Alertness: Awake/alert Orientation Level: Appears intact for tasks assessed Behavior During Session: [facility] for tasks performed    Balance     End of Session PT - End of Session Equipment Utilized During Treatment: Gait belt Activity Tolerance: Patient limited by fatigue Patient left: in bed;with call bell/phone within reach;with family/visitor present (sitting EOB) Nurse Communication: Mobility status (Decrease in functional mobility/balance today)   GP     Vena Austria 04/09/2012, 5:13 PM Durenda Hurt. Renaldo Fiddler, [facility] Acute Rehab Services Pager [phone]

## 2012-04-09 NOTE — Progress Notes (Signed)
Patient was discharged home with daughter. Patient was discharged home with foley catheter. Patient and daughter were educated on care and instructions on how to switch to a leg bag. Patient was given a handout of foley care instructions. Patient was given discharge prescriptions. Patient and daughter felt patient would benefit from another night stay at the hospital. Patient was asked reasoning why he felt he needed to stay. His answer was, " I feel tired. I just need a nights rest." Patient and family were then explained to why they were being discharged. They agreed to a PT evaluation for home health PT. PT recommended PT home health with walked. Case manager said she would set that up with patient tomorrow at his house. Charge nurse, Synetta Fail spoke to patient and daughter about discharge and their concerns, as well. Patient was stable upon discharge.

## 2012-04-09 NOTE — Progress Notes (Signed)
New order received for 12 lead EKG. Will notify MD of results. Will continue to monitor.

## 2012-04-09 NOTE — Progress Notes (Signed)
MD notified of ekg result of AV pacing no third pacer spike observed. No new orders. Will continue to monitor.

## 2012-04-10 NOTE — Progress Notes (Signed)
NCM spoke to pt and gave permission to speak to son, Shown Dissinger cell # [phone]. States his dad lives at home alone. His sister, Jceon Alverio cell # [phone] helps with his care during the week. He works during the week. NCM offered choice for all the agencies that accept his insurance. Son requested [facility] for [facility]. Explained agency will call and schedule appt. Provided son with [facility] contact info. Faxed referral to [facility] for dc 2/1. Isidoro Donning RN CCM Case Mgmt phone [phone]

## 2012-04-11 ENCOUNTER — Encounter (HOSPITAL_COMMUNITY): Payer: Self-pay | Admitting: *Deleted

## 2012-04-11 ENCOUNTER — Observation Stay (HOSPITAL_COMMUNITY)
Admission: EM | Admit: 2012-04-11 | Discharge: 2012-04-13 | Disposition: A | Discharge status: Skilled Nursing Facility | Payer: Medicare Other | Attending: Internal Medicine | Admitting: Internal Medicine

## 2012-04-11 ENCOUNTER — Emergency Department (HOSPITAL_COMMUNITY): Payer: Medicare Other

## 2012-04-11 DIAGNOSIS — I442 Atrioventricular block, complete: Secondary | ICD-10-CM

## 2012-04-11 DIAGNOSIS — Z85819 Personal history of malignant neoplasm of unspecified site of lip, oral cavity, and pharynx: Secondary | ICD-10-CM | POA: Insufficient documentation

## 2012-04-11 DIAGNOSIS — R5383 Other fatigue: Secondary | ICD-10-CM | POA: Insufficient documentation

## 2012-04-11 DIAGNOSIS — E079 Disorder of thyroid, unspecified: Secondary | ICD-10-CM

## 2012-04-11 DIAGNOSIS — K625 Hemorrhage of anus and rectum: Secondary | ICD-10-CM

## 2012-04-11 DIAGNOSIS — I10 Essential (primary) hypertension: Secondary | ICD-10-CM | POA: Diagnosis present

## 2012-04-11 DIAGNOSIS — N179 Acute kidney failure, unspecified: Secondary | ICD-10-CM

## 2012-04-11 DIAGNOSIS — Z8546 Personal history of malignant neoplasm of prostate: Secondary | ICD-10-CM

## 2012-04-11 DIAGNOSIS — E785 Hyperlipidemia, unspecified: Secondary | ICD-10-CM

## 2012-04-11 DIAGNOSIS — N390 Urinary tract infection, site not specified: Secondary | ICD-10-CM

## 2012-04-11 DIAGNOSIS — R7989 Other specified abnormal findings of blood chemistry: Secondary | ICD-10-CM

## 2012-04-11 DIAGNOSIS — K5900 Constipation, unspecified: Secondary | ICD-10-CM

## 2012-04-11 DIAGNOSIS — C61 Malignant neoplasm of prostate: Secondary | ICD-10-CM

## 2012-04-11 DIAGNOSIS — R338 Other retention of urine: Secondary | ICD-10-CM | POA: Insufficient documentation

## 2012-04-11 DIAGNOSIS — C140 Malignant neoplasm of pharynx, unspecified: Secondary | ICD-10-CM

## 2012-04-11 DIAGNOSIS — R911 Solitary pulmonary nodule: Secondary | ICD-10-CM | POA: Diagnosis present

## 2012-04-11 DIAGNOSIS — D72829 Elevated white blood cell count, unspecified: Secondary | ICD-10-CM

## 2012-04-11 DIAGNOSIS — R531 Weakness: Secondary | ICD-10-CM

## 2012-04-11 DIAGNOSIS — A498 Other bacterial infections of unspecified site: Secondary | ICD-10-CM | POA: Insufficient documentation

## 2012-04-11 DIAGNOSIS — R5381 Other malaise: Secondary | ICD-10-CM | POA: Insufficient documentation

## 2012-04-11 DIAGNOSIS — K219 Gastro-esophageal reflux disease without esophagitis: Secondary | ICD-10-CM | POA: Diagnosis present

## 2012-04-11 DIAGNOSIS — Z950 Presence of cardiac pacemaker: Secondary | ICD-10-CM

## 2012-04-11 DIAGNOSIS — E8809 Other disorders of plasma-protein metabolism, not elsewhere classified: Secondary | ICD-10-CM | POA: Insufficient documentation

## 2012-04-11 DIAGNOSIS — R339 Retention of urine, unspecified: Secondary | ICD-10-CM | POA: Diagnosis present

## 2012-04-11 DIAGNOSIS — N12 Tubulo-interstitial nephritis, not specified as acute or chronic: Principal | ICD-10-CM

## 2012-04-11 HISTORY — DX: Ulcer of intestine: K63.3

## 2012-04-11 HISTORY — DX: Old myocardial infarction: I25.2

## 2012-04-11 LAB — URINALYSIS, MICROSCOPIC ONLY
Ketones, ur: NEGATIVE mg/dL
Nitrite: NEGATIVE
Specific Gravity, Urine: 1.016 (ref 1.005–1.030)
Urobilinogen, UA: >8 mg/dL — ABNORMAL HIGH (ref 0.0–1.0)
pH: 6 (ref 5.0–8.0)

## 2012-04-11 LAB — BASIC METABOLIC PANEL
Chloride: 100 mEq/L (ref 96–112)
Creatinine, Ser: 1.26 mg/dL (ref 0.50–1.35)
GFR calc Af Amer: 59 mL/min — ABNORMAL LOW (ref >90)
GFR calc non Af Amer: 51 mL/min — ABNORMAL LOW (ref >90)
Potassium: 3.6 mEq/L (ref 3.5–5.1)

## 2012-04-11 LAB — URINALYSIS, ROUTINE W REFLEX MICROSCOPIC
Nitrite: NEGATIVE
Protein, ur: 30 mg/dL — AB
Urobilinogen, UA: 4 mg/dL — ABNORMAL HIGH (ref 0.0–1.0)

## 2012-04-11 LAB — CBC
MCHC: 34.2 g/dL (ref 30.0–36.0)
Platelets: 490 10*3/uL — ABNORMAL HIGH (ref 150–400)
RDW: 15.1 % (ref 11.5–15.5)
WBC: 21.5 10*3/uL — ABNORMAL HIGH (ref 4.0–10.5)

## 2012-04-11 LAB — URINE MICROSCOPIC-ADD ON

## 2012-04-11 LAB — GLUCOSE, CAPILLARY: Glucose-Capillary: 95 mg/dL (ref 70–99)

## 2012-04-11 MED ORDER — DEXTROSE 5 % IV SOLN
1.0000 g | Freq: Once | INTRAVENOUS | Status: AC
  Administered 2012-04-11: 1 g via INTRAVENOUS
  Filled 2012-04-11: qty 10

## 2012-04-11 NOTE — ED Notes (Signed)
Scott [name], pt daughter- [phone] (home)  [phone] (cell)

## 2012-04-11 NOTE — ED Notes (Signed)
Pt was admitted to [facility] last Sunday for kidney failure/uti.  He was sent home this Sat with a foley catheter back to his home where he lives by himself.  Pt has not had enough strength to get out of his chair since leaving for home.  Family called pcp and pcp, Dr Lucky Cowboy, stated to come to ED and be evaluated to be sent to a rehab center to build up his strength until he is able to take care of himself.

## 2012-04-11 NOTE — ED Provider Notes (Signed)
History     CSN: [id number]  Arrival date & time 04/11/12  1643   First MD Initiated Contact with Patient 04/11/12 1824      No chief complaint on file.   (Consider location/radiation/quality/duration/timing/severity/associated sxs/prior treatment) HPI Comments: Patient recently discharged from hospital after treatment for pyelonephritis, e. Coli UTI, ARF.  Since his return home two days ago, he reports being too weak to help himself.  Denies fever, cough. First bowel movement since return home was this afternoon.  No abdominal pain.   HHPT visited with patient today, and family states they recommended patient would benefit from rehab placement.     Past Medical History  Diagnosis Date  . Hypertension   . Thyroid disease     hypothyroidism  . Hyperlipidemia   . GERD (gastroesophageal reflux disease)   . Prostate cancer   . Cancer     throat  . Intestinal ulcer   . MI, old     Past Surgical History  Procedure Date  . Pacemaker insertion     dul chamber St Jude 06/14/03  . Cataract extraction     Family History  Problem Relation Age of Onset  . Heart disease Mother 2    History  Substance Use Topics  . Smoking status: Former Games developer  . Smokeless tobacco: Former Neurosurgeon  . Alcohol Use: No      Review of Systems  Constitutional: Positive for fatigue.  Cardiovascular: Positive for leg swelling.  Gastrointestinal: Positive for constipation. Negative for nausea and vomiting.  Neurological: Positive for weakness.  All other systems reviewed and are negative.    Allergies  Penicillins  Home Medications   Current Outpatient Rx  Name  Route  Sig  Dispense  Refill  . AMITRIPTYLINE HCL 50 MG PO TABS   Oral   Take 50 mg by mouth at bedtime.           . AZELASTINE HCL 137 MCG/SPRAY NA SOLN   Nasal   Place 1 spray into the nose 2 (two) times daily. Use in each nostril as directed          . VITAMIN D-3 PO   Oral   Take 4,000 Units by mouth.           .  CYCLOBENZAPRINE HCL 7.5 MG PO TABS   Oral   Take 1 tablet (7.5 mg total) by mouth 3 (three) times daily as needed for muscle spasms.   90 tablet   3   . GUAIFENESIN-DM 100-10 MG/5ML PO SYRP   Oral   Take 5 mLs by mouth every 4 (four) hours as needed for cough.   240 mL   0   . LANSOPRAZOLE 30 MG PO CPDR   Oral   Take 30 mg by mouth daily.          Marland Kitchen LEVOFLOXACIN 250 MG PO TABS   Oral   Take 1 tablet (250 mg total) by mouth daily. For 7 more days, then stop   7 tablet   0   . LEVOTHYROXINE SODIUM 100 MCG PO TABS   Oral   Take 100 mcg by mouth daily.           Marland Kitchen LIDOCAINE 4 % EX CREA   Topical   Apply topically daily as needed (pain).   30 g   0   . PRAVASTATIN SODIUM 40 MG PO TABS   Oral   Take 40 mg by mouth daily.           Marland Kitchen  TAMSULOSIN HCL 0.4 MG PO CAPS   Oral   Take 1 capsule (0.4 mg total) by mouth daily.   30 capsule   3   . ZOLPIDEM TARTRATE 5 MG PO TABS   Oral   Take 1 tablet (5 mg total) by mouth at bedtime as needed for sleep.   30 tablet   1     BP 115/67  Pulse 102  Temp 97.6 F (36.4 C) (Oral)  Resp 20  SpO2 96%  Physical Exam  Constitutional: He appears well-developed. He is cooperative. No distress.  HENT:  Head: Normocephalic.  Eyes: Pupils are equal, round, and reactive to light.  Neck: Normal range of motion.  Cardiovascular: Normal rate and regular rhythm.   Pulmonary/Chest: Effort normal and breath sounds normal.  Abdominal: Soft. Bowel sounds are normal.  Musculoskeletal: He exhibits edema.  Lymphadenopathy:    He has no cervical adenopathy.  Neurological: He is alert.  Skin: Skin is warm and dry.  Psychiatric: He has a normal mood and affect. His behavior is normal. Judgment and thought content normal.    ED Course  Procedures (including critical care time)  Labs Reviewed  URINALYSIS, ROUTINE W REFLEX MICROSCOPIC - Abnormal; Notable for the following:    Color, Urine AMBER (*)  BIOCHEMICALS MAY BE AFFECTED BY  COLOR   APPearance TURBID (*)     Hgb urine dipstick LARGE (*)     Bilirubin Urine SMALL (*)     Protein, ur 30 (*)     Urobilinogen, UA 4.0 (*)     Leukocytes, UA LARGE (*)     All other components within normal limits  URINE MICROSCOPIC-ADD ON - Abnormal; Notable for the following:    Bacteria, UA MANY (*)     All other components within normal limits  GLUCOSE, CAPILLARY  URINE CULTURE  CBC  BASIC METABOLIC PANEL   No results found.   No diagnosis found.  Discussed with Dr. Patria Mane.  Will remove existing foley, insert new and repeat urinalysis.  CT exam of abdomen/pelvis requested to evaluate kidneys.  Labs reveal leukocytosis, persistent UTI. CT findings suggestive of pyelonephritis.  Contacted hospitalist for admission.  MDM          Jimmye Norman, NP 04/11/12 2236

## 2012-04-12 DIAGNOSIS — N12 Tubulo-interstitial nephritis, not specified as acute or chronic: Secondary | ICD-10-CM

## 2012-04-12 DIAGNOSIS — N390 Urinary tract infection, site not specified: Secondary | ICD-10-CM

## 2012-04-12 DIAGNOSIS — D72829 Elevated white blood cell count, unspecified: Secondary | ICD-10-CM

## 2012-04-12 DIAGNOSIS — E785 Hyperlipidemia, unspecified: Secondary | ICD-10-CM | POA: Insufficient documentation

## 2012-04-12 DIAGNOSIS — R5381 Other malaise: Secondary | ICD-10-CM

## 2012-04-12 LAB — CULTURE, BLOOD (ROUTINE X 2)
Culture: NO GROWTH
Culture: NO GROWTH

## 2012-04-12 LAB — URINE CULTURE
Colony Count: NO GROWTH
Colony Count: NO GROWTH
Culture: NO GROWTH

## 2012-04-12 LAB — CBC
HCT: 34 % — ABNORMAL LOW (ref 39.0–52.0)
HCT: 30.8 % — ABNORMAL LOW (ref 39.0–52.0)
Hemoglobin: 10.5 g/dL — ABNORMAL LOW (ref 13.0–17.0)
MCH: 28.9 pg (ref 26.0–34.0)
MCH: 28.5 pg (ref 26.0–34.0)
MCHC: 34.1 g/dL (ref 30.0–36.0)
MCV: 84.8 fL (ref 78.0–100.0)
MCV: 83.7 fL (ref 78.0–100.0)
Platelets: 509 10*3/uL — ABNORMAL HIGH (ref 150–400)
Platelets: 508 10*3/uL — ABNORMAL HIGH (ref 150–400)
RBC: 4.01 MIL/uL — ABNORMAL LOW (ref 4.22–5.81)
RDW: 15 % (ref 11.5–15.5)
WBC: 18.4 10*3/uL — ABNORMAL HIGH (ref 4.0–10.5)
WBC: 16 10*3/uL — ABNORMAL HIGH (ref 4.0–10.5)

## 2012-04-12 LAB — BASIC METABOLIC PANEL
Calcium: 7.7 mg/dL — ABNORMAL LOW (ref 8.4–10.5)
GFR calc Af Amer: 63 mL/min — ABNORMAL LOW (ref >90)
GFR calc non Af Amer: 55 mL/min — ABNORMAL LOW (ref >90)
Sodium: 134 mEq/L — ABNORMAL LOW (ref 135–145)

## 2012-04-12 LAB — CREATININE, SERUM: GFR calc Af Amer: 56 mL/min — ABNORMAL LOW (ref >90)

## 2012-04-12 MED ORDER — GUAIFENESIN-DM 100-10 MG/5ML PO SYRP
5.0000 mL | ORAL_SOLUTION | ORAL | Status: DC | PRN
  Filled 2012-04-12: qty 5

## 2012-04-12 MED ORDER — PANTOPRAZOLE SODIUM 40 MG PO TBEC
40.0000 mg | DELAYED_RELEASE_TABLET | Freq: Every day | ORAL | Status: DC
  Administered 2012-04-12 – 2012-04-13 (×2): 40 mg via ORAL
  Filled 2012-04-12: qty 1
  Filled 2012-04-12: qty 2
  Filled 2012-04-12: qty 1

## 2012-04-12 MED ORDER — LEVOFLOXACIN 750 MG PO TABS
750.0000 mg | ORAL_TABLET | Freq: Every day | ORAL | Status: DC
  Administered 2012-04-12 – 2012-04-13 (×2): 750 mg via ORAL
  Filled 2012-04-12 (×2): qty 1

## 2012-04-12 MED ORDER — ZOLPIDEM TARTRATE 5 MG PO TABS: 5.0000 mg | ORAL_TABLET | Freq: Every evening | ORAL | Status: DC | PRN

## 2012-04-12 MED ORDER — POTASSIUM CHLORIDE CRYS ER 20 MEQ PO TBCR
20.0000 meq | EXTENDED_RELEASE_TABLET | Freq: Two times a day (BID) | ORAL | Status: AC
  Administered 2012-04-12 (×2): 20 meq via ORAL
  Filled 2012-04-12 (×2): qty 1

## 2012-04-12 MED ORDER — OXYMETAZOLINE HCL 0.05 % NA SOLN: 2.0000 | Freq: Two times a day (BID) | NASAL | Status: DC | PRN

## 2012-04-12 MED ORDER — TROLAMINE SALICYLATE 10 % EX CREA
TOPICAL_CREAM | Freq: Two times a day (BID) | CUTANEOUS | Status: DC | PRN
  Filled 2012-04-12: qty 85

## 2012-04-12 MED ORDER — SIMVASTATIN 20 MG PO TABS
20.0000 mg | ORAL_TABLET | Freq: Every day | ORAL | Status: DC
  Administered 2012-04-12: 20 mg via ORAL
  Filled 2012-04-12 (×2): qty 1

## 2012-04-12 MED ORDER — HEPARIN SODIUM (PORCINE) 5000 UNIT/ML IJ SOLN
5000.0000 [IU] | Freq: Three times a day (TID) | INTRAMUSCULAR | Status: DC
  Administered 2012-04-12 – 2012-04-13 (×4): 5000 [IU] via SUBCUTANEOUS
  Filled 2012-04-12 (×8): qty 1

## 2012-04-12 MED ORDER — AMITRIPTYLINE HCL 50 MG PO TABS
50.0000 mg | ORAL_TABLET | Freq: Every day | ORAL | Status: DC
  Administered 2012-04-12 (×2): 50 mg via ORAL
  Filled 2012-04-12 (×3): qty 1

## 2012-04-12 MED ORDER — LEVOFLOXACIN 250 MG PO TABS
250.0000 mg | ORAL_TABLET | Freq: Every day | ORAL | Status: DC
  Filled 2012-04-12: qty 1

## 2012-04-12 MED ORDER — SALINE SPRAY 0.65 % NA SOLN: 1.0000 | NASAL | Status: DC | PRN

## 2012-04-12 MED ORDER — AZELASTINE HCL 0.1 % NA SOLN
1.0000 | Freq: Two times a day (BID) | NASAL | Status: DC
  Administered 2012-04-12: 1 via NASAL
  Filled 2012-04-12: qty 30

## 2012-04-12 MED ORDER — LEVOTHYROXINE SODIUM 100 MCG PO TABS
100.0000 ug | ORAL_TABLET | Freq: Every day | ORAL | Status: DC
  Administered 2012-04-12 – 2012-04-13 (×2): 100 ug via ORAL
  Filled 2012-04-12 (×3): qty 1

## 2012-04-12 NOTE — Progress Notes (Signed)
[facility]  Patient Status: Active (receiving services up to time of hospitalization)  [facility] is providing the following services: RN and PT  If patient discharges after hours, please call [phone].   Scott [name] 04/12/2012, 10:36 AM

## 2012-04-12 NOTE — Evaluation (Signed)
Physical Therapy Evaluation Patient Details Name: ANGELINO RUMERY MRN: [id number] DOB: 1925/07/14 Today's Date: 04/12/2012 Time: 4098-1191 PT Time Calculation (min): 13 min  PT Assessment / Plan / Recommendation Clinical Impression  Pt readmitted with pyelonephritis and weakness.  Pt has continued to have decline in mobility since dc and currently unable to mobilize without assistance.  Pt does not have 24 hour assist at home.  Recommend ST-SNF.    PT Assessment  Patient needs continued PT services    Follow Up Recommendations  SNF    Does the patient have the potential to tolerate intense rehabilitation      Barriers to Discharge Decreased caregiver support      Equipment Recommendations  None recommended by PT    Recommendations for Other Services     Frequency Min 3X/week    Precautions / Restrictions Precautions Precautions: Fall Restrictions Weight Bearing Restrictions: No   Pertinent Vitals/Pain Rt shoulder pain 7/10.  Nurse aware.      Mobility  Bed Mobility Supine to Sit: 4: Min assist;HOB elevated Sitting - Scoot to Edge of Bed: 4: Min assist Details for Bed Mobility Assistance: Assist to bring trunk up. Transfers Sit to Stand: 4: Min assist;With upper extremity assist;From bed Stand to Sit: 4: Min assist;With upper extremity assist;To chair/3-in-1 Details for Transfer Assistance: Verbal cues for hand placement. Assist to lift hips up. Ambulation/Gait Ambulation/Gait Assistance: 4: Min assist Ambulation Distance (Feet): 75 Feet Assistive device: Rolling walker Ambulation/Gait Assistance Details: Verbal cues to stay closer to walker and keep feet inside walker with turns. Cues to stand more erect.  Assist for balance Gait Pattern: Step-through pattern;Decreased stride length;Trunk flexed Gait velocity: <1.8 ft/sec which places pt at high fall risk. General Gait Details: Dyspnea 2/4 with amb.    Exercises     PT Diagnosis: Generalized  weakness;Difficulty walking  PT Problem List: Decreased strength;Decreased activity tolerance;Decreased balance;Decreased mobility;Decreased knowledge of use of DME PT Treatment Interventions: DME instruction;Gait training;Functional mobility training;Patient/family education;Therapeutic activities;Therapeutic exercise;Balance training   PT Goals Acute Rehab PT Goals PT Goal Formulation: With patient Time For Goal Achievement: 04/19/12 Potential to Achieve Goals: Good Pt will go Supine/Side to Sit: with supervision PT Goal: Supine/Side to Sit - Progress: Goal set today Pt will go Sit to Supine/Side: with supervision PT Goal: Sit to Supine/Side - Progress: Goal set today Pt will go Sit to Stand: with supervision PT Goal: Sit to Stand - Progress: Goal set today Pt will go Stand to Sit: with supervision PT Goal: Stand to Sit - Progress: Goal set today Pt will Ambulate: 51 - 150 feet;with supervision;with least restrictive assistive device PT Goal: Ambulate - Progress: Goal set today  Visit Information  Last PT Received On: 04/12/12 Assistance Needed: +1    Subjective Data  Subjective: Pt states that he feels weak. Patient Stated Goal: Get stronger   Prior Functioning  Home Living Lives With: Alone Available Help at Discharge: Family;Available PRN/intermittently Type of Home: House Home Access: Level entry Home Layout: One level Bathroom Shower/Tub: Health visitor: Standard Home Adaptive Equipment: Straight cane;Walker - rolling Additional Comments: Was just dc'd on 2/1 Prior Function Level of Independence: Independent (prior to recent hospitalization but not since dc home) Driving: Yes (prior to hospitalization last week) Vocation: Retired Musician: No difficulties Dominant Hand: Right    Cognition  Cognition Overall Cognitive Status: Appears within functional limits for tasks assessed/performed Arousal/Alertness:  Awake/alert Orientation Level: Appears intact for tasks assessed Behavior During Session: [facility] for tasks  performed    Extremity/Trunk Assessment Right Lower Extremity Assessment RLE ROM/Strength/Tone: Deficits RLE ROM/Strength/Tone Deficits: grossly 4/5 Left Lower Extremity Assessment LLE ROM/Strength/Tone: Deficits LLE ROM/Strength/Tone Deficits: grossly 4/5   Balance Static Standing Balance Static Standing - Balance Support: Bilateral upper extremity supported (on walker) Static Standing - Level of Assistance: 4: Min assist  End of Session PT - End of Session Equipment Utilized During Treatment: Gait belt Activity Tolerance: Patient limited by fatigue Patient left: in chair;with call bell/phone within reach Nurse Communication: Mobility status  GP Functional Assessment Tool Used: clinical judgement Functional Limitation: Mobility: Walking and moving around Mobility: Walking and Moving Around Current Status [phone]): At least 1 percent but less than 20 percent impaired, limited or restricted Mobility: Walking and Moving Around Goal Status [phone]): 0 percent impaired, limited or restricted   [facility] 04/12/2012, 9:39 AM  [facility] PT [phone]

## 2012-04-12 NOTE — Clinical Social Work Placement (Addendum)
Clinical Social Work Department CLINICAL SOCIAL WORK PLACEMENT NOTE 04/12/2012  Patient:  Scott [name], Scott [name]  Account Number:  [id number] Admit date:  04/11/2012  Clinical Social Worker:  Johnsie Cancel  Date/time:  04/12/2012 01:00 PM  Clinical Social Work is seeking post-discharge placement for this patient at the following level of care:   SKILLED NURSING   (*CSW will update this form in Epic as items are completed)   04/12/2012  Patient/family provided with [facility] Department of Clinical Social Work's list of facilities offering this level of care within the geographic area requested by the patient (or if unable, by the patient's family).  04/12/2012  Patient/family informed of their freedom to choose among providers that offer the needed level of care, that participate in Medicare, Medicaid or managed care program needed by the patient, have an available bed and are willing to accept the patient.  04/12/2012  Patient/family informed of [facility]' ownership interest in [facility], as well as of the fact that they are under no obligation to receive care at this facility.  PASARR submitted to EDS on 04/13/2012 PASARR number received from EDS on 04/13/2012  FL2 transmitted to all facilities in geographic area requested by pt/family on  04/12/2012 FL2 transmitted to all facilities within larger geographic area on N/A  Patient informed that his/her managed care company has contracts with or will negotiate with  certain facilities, including the following:     Patient/family informed of bed offers received:  04/12/2012 Patient chooses bed at [place] [facility]: [place] Physician recommends and patient chooses bed at  N/A  Patient to be transferred to  on  [place] [facility]: [place] 04/13/2012 Patient to be transferred to facility by [facility]  The following physician request were entered in Epic:   Additional Comments:  Lia Foyer, LCSWA [facility] Clinical  Social Worker Contact #: [phone]

## 2012-04-12 NOTE — H&P (Signed)
[facility] History and Physical  Scott [name] UUV:[id number] DOB: 14-Jul-1925 DOA: 04/11/2012  Referring physician: ED PCP: Nadean Corwin, MD  Specialists: [place]  Chief Complaint: Weakness  HPI: Scott [name] is a 79 y.o. male who was just discharged from the hospital 2 days ago and is recovering from pyleonephritis.  Unfortunately while his generalized weakness has been improving since arriving at home he is not able to get up and ambulate at this point in time, as a result his family has called his PCP who sent him back to the ED to be evaluated and likely sent to rehab.  In the ED, his WBC while still elevated is markedly down since his prior admission (when it was over 60k).  He was given a dose of rocephin and hospitalist asked to admit for rehab placement.  Review of Systems: 12 systems reviewed and otherwise negative.  Past Medical History  Diagnosis Date  . Hypertension   . Thyroid disease     hypothyroidism  . Hyperlipidemia   . GERD (gastroesophageal reflux disease)   . Prostate cancer   . Cancer     throat  . Intestinal ulcer   . MI, old    Past Surgical History  Procedure Date  . Pacemaker insertion     dul chamber St Jude 06/14/03  . Cataract extraction    Social History:  reports that he has quit smoking. He has quit using smokeless tobacco. He reports that he does not drink alcohol or use illicit drugs.   Allergies  Allergen Reactions  . Penicillins     Family History  Problem Relation Age of Onset  . Heart disease Mother 47    Prior to Admission medications   Medication Sig Start Date End Date Taking? Authorizing Provider  amitriptyline (ELAVIL) 50 MG tablet Take 50 mg by mouth at bedtime.    Yes Historical Provider, MD  Aspirin-Salicylamide-Caffeine (BC HEADACHE POWDER PO) Take 1 packet by mouth daily as needed. For pain   Yes Historical Provider, MD  azelastine (ASTELIN) 137 MCG/SPRAY nasal spray Place 1 spray into the nose 2  (two) times daily. Use in each nostril as directed    Yes Historical Provider, MD  guaiFENesin-dextromethorphan (ROBITUSSIN DM) 100-10 MG/5ML syrup Take 5 mLs by mouth every 4 (four) hours as needed for cough. 04/09/12  Yes Ripudeep Jenna Luo, MD  lansoprazole (PREVACID) 30 MG capsule Take 30 mg by mouth daily.    Yes Historical Provider, MD  levofloxacin (LEVAQUIN) 250 MG tablet Take 1 tablet (250 mg total) by mouth daily. For 7 more days, then stop 04/09/12  Yes Ripudeep K Rai, MD  levothyroxine (SYNTHROID, LEVOTHROID) 100 MCG tablet Take 100 mcg by mouth daily.     Yes Historical Provider, MD  lidocaine (LMX) 4 % cream Apply topically daily as needed (pain). 04/09/12  Yes Ripudeep Jenna Luo, MD  pravastatin (PRAVACHOL) 40 MG tablet Take 40 mg by mouth daily.     Yes Historical Provider, MD  zolpidem (AMBIEN) 5 MG tablet Take 1 tablet (5 mg total) by mouth at bedtime as needed for sleep. 04/09/12  Yes Ripudeep Jenna Luo, MD   Physical Exam: Filed Vitals:   04/11/12 1646 04/11/12 1651 04/11/12 2213  BP: 115/66 115/67 112/64  Pulse: 98 102 95  Temp: 97.6 F (36.4 C) 97.6 F (36.4 C) 98.6 F (37 C)  TempSrc: Oral Oral Oral  Resp: 18 20 17   SpO2: 96% 96% 96%    General:  NAD, resting  comfortably in bed Eyes: PEERLA EOMI ENT: mucous membranes moist Neck: supple w/o JVD Cardiovascular: RRR w/o MRG Respiratory: CTA B Abdomen: soft, nt, nd, bs+ Skin: no rash nor lesion Musculoskeletal: MAE, generalized weakness noted, patient able to raise arms which he states is a significant improvement from a few days ago. Psychiatric: normal tone and affect Neurologic: AAOx3, grossly non-focal  Labs on Admission:  Basic Metabolic Panel:  Lab 04/11/12 1478 04/09/12 0530 04/08/12 1009 04/07/12 0750 04/06/12 1035  NA 136 137 139 139 138  K 3.6 4.0 4.2 4.1 4.6  CL 100 105 106 106 104  CO2 25 25 25 22 20   GLUCOSE 96 97 106* 140* 99  BUN 19 28* 36* 62* 85*  CREATININE 1.26 1.27 1.48* 1.93* 2.70*  CALCIUM 8.1* 7.5*  8.1* 7.9* 7.9*  MG -- -- -- -- --  PHOS -- -- -- -- --   Liver Function Tests:  Lab 04/05/12 1251  AST 62*  ALT 37  ALKPHOS 290*  BILITOT 0.8  PROT 6.1  ALBUMIN 2.2*   No results found for this basename: LIPASE:5,AMYLASE:5 in the last 168 hours No results found for this basename: AMMONIA:5 in the last 168 hours CBC:  Lab 04/11/12 1809 04/09/12 0530 04/08/12 1009 04/07/12 0750 04/06/12 0227  WBC 21.5* 19.6* 17.6* 16.6* 22.1*  NEUTROABS -- -- -- -- --  HGB 12.8* 12.0* 12.8* 11.8* 12.0*  HCT 37.4* 35.5* 37.7* 35.4* 33.9*  MCV 85.2 85.3 85.3 84.1 81.5  PLT 490* 224 183 103* 92*   Cardiac Enzymes:  Lab 04/06/12 2112 04/06/12 1529 04/06/12 1035 04/06/12 0227 04/05/12 2002  CKTOTAL 34 40 44 -- --  CKMB 4.1* 4.4* 4.4* -- --  CKMBINDEX -- -- -- -- --  TROPONINI <0.30 <0.30 0.30* 0.41* <0.30    BNP (last 3 results) No results found for this basename: PROBNP:3 in the last 8760 hours CBG:  Lab 04/11/12 1723 04/05/12 2257  GLUCAP 95 80    Radiological Exams on Admission: Ct Abdomen Pelvis Wo Contrast  04/11/2012  *RADIOLOGY REPORT*  Clinical Data: Recent urinary tract infection.  Now with weakness and fever.  Leukocytosis.  CT ABDOMEN AND PELVIS WITHOUT CONTRAST  Technique:  Multidetector CT imaging of the abdomen and pelvis was performed following the standard protocol without intravenous contrast.  Comparison: None.  Findings: Small bilateral pleural effusions, greater on the right. Atelectasis and scarring in the lung bases.  Multiple exophytic masses arising from both kidneys.  Some of these demonstrate increased density consistent with hemorrhagic cysts. Some lesions have low Hounsfield units consistent with cysts. There is however a 3.9 cm diameter lesion in the right lower pole which is indeterminate Hounsfield measurements and could represent hemorrhagic cyst or solid mass.  Recommend ultrasound or contrast enhanced CT of the kidneys to exclude solid lesions.  No renal or  ureteral stones are demonstrated.  No pyelocaliectasis or ureterectasis.  There is infiltration in the pararenal fat bilaterally.  Changes suggest pyelonephritis.  No focal abscess demonstrated.  The bladder is somewhat decompressed with Foley catheter but the urine appears to have increased density suggesting complex fluid or blood.  The unenhanced appearance of the liver, spleen, contracted gallbladder, pancreas, adrenal glands, and retroperitoneal lymph nodes is unremarkable.  Calcification of the abdominal aorta without aneurysm.  The stomach and small bowel are decompressed. The colon is not abnormally distended.  No free air or free fluid in the abdomen.  Pelvis:  Seed implants in the prostate gland.  No definite pelvic  lymphadenopathy.  No free or loculated pelvic fluid collections. Mild infiltration in the pelvic fat could represent inflammatory change or edema.  No evidence of diverticulitis.  The appendix is normal.  Normal degenerative changes throughout the thoracic spine. No focal bone lesions are appreciated.  IMPRESSION: Infiltration in the para renal fat bilaterally suggesting pyelonephritis.  No hydronephrosis.  Increased density of the urine suggesting blood or infected urine.  Multiple bilateral renal mass lesions.  Most of these appear to represent cysts but there is an indeterminate lesion in the right kidney.  Contrast enhanced CT or ultrasound is recommended to exclude solid mass.   Original Report Authenticated By: Burman Nieves, M.D.     EKG: Independently reviewed.  Assessment/Plan Principal Problem:  *Weakness Active Problems:   Ecoli UTI (urinary tract infection)  Pyelonephritis   1. Weakness - due to recent acute illness while this is improving, patients inability to get out of chair at home does likely qualify him for some form of rehab, will admit patient, order PT/OT to eval and treat, SW for likely rehab placement which the patient is agreeable to. 2. Ecoli UTI and  pyleonephritis - appears to be sensitive to levaquin, will continue PO levaquin as inpatient, repeat cultures and UA have been ordered but likely the reason his urine is still showing the infection is simply because he has yet to complete his course of antibiotics.    Code Status: Full (must indicate code status--if unknown or must be presumed, indicate so) Family Communication: No family in room (indicate person spoken with, if applicable, with phone number if by telephone) Disposition Plan: Admit to obs (indicate anticipated LOS)  Time spent:  GARDNER, JARED M. [facility] Pager [phone]  If 7PM-7AM, please contact night-coverage [email or web address] Password [facility] 04/12/2012, 1:43 AM

## 2012-04-12 NOTE — Progress Notes (Signed)
Pt admitted to the unit. Pt is alert and oriented. Pt oriented to room, staff, and call bell. Bed in lowest position. Full assessment to Epic. Call bell with in reach. Told to call for assists. Will continue to monitor.  Scott [name] E  

## 2012-04-12 NOTE — Progress Notes (Signed)
[facility] PROGRESS NOTE  Scott [name] ZOX:[id number] DOB: [birth year] DOA: 04/11/2012 PCP: Nadean Corwin, MD  Assessment/Plan:  Ecoli UTI and pyleonephritis -  Sensitive to levaquin.  Dosage increased to 750 daily 2/4.  U/A still + for infection.  Urine dark in color. > 8.0 urobilinogen on u/a 2/3. Check LFTs in am.  Alk phos elevated (290) on 1/28.  Liver unremarkable on 2/3 ct abdomen.  Urinary retention with a history of prostate CA  - patient unable to void sufficiently at discharge 1/26. -  Foley Catheter left in place. -Voiding trial in 2 weeks-will start on Flomax  Weakness  - due physical deconditioning to recent acute illness.   -Patient unable to mobilize or stand up.PT recommends SNF.  Thyroid Disease  - history of.  On levothyroxine.  Given current weakness will check a TSH, Free T4 in am.  HTN - WNL not on BP meds.  GERD  - on protonix  H/O Complete heart block with PPM ([employer]).  Stable  Spiculated nodule of left lung From 1/26 d/c summary:  CT chest shows 2.7cm spiculated LUL nodule suspicious for malignancy, will need tissue diagnosis. Discussed with pulmonology, per Dr Marchelle Gearing (PCCM), given cardiac issues, recovering renal function, sepsis, it can be done out-patient with PET scan first. Dr Marchelle Gearing will make arrangements for PET scan and PFT's prior to the biopsy.  H/O throat cancer.  Presumed stable.  No complaints.  Code Status: full Family Communication:  Disposition Plan: to skilled nursing facility at discharge.    Antibiotics:  Continued on Levaquin  HPI/Subjective: Scott [name] is a 79 y.o. male who was just discharged from the hospital 2 days ago and is recovering from pyleonephritis. Unfortunately while his generalized weakness has been improving since arriving at home he is not able to get up and ambulate at this point in time, as a result his family has called his PCP who sent him back to the ED to be  evaluated.  2/4  Complaining of right shoulder pain.   Objective: Filed Vitals:   04/11/12 1651 04/11/12 2213 04/12/12 0304 04/12/12 0520  BP: 115/67 112/64 132/71 115/63  Pulse: 102 95 100 106  Temp: 97.6 F (36.4 C) 98.6 F (37 C) 97.9 F (36.6 C) 99.7 F (37.6 C)  TempSrc: Oral Oral Oral Axillary  Resp: 20 17 17 17   Height:   6' (1.829 m)   Weight:   92.3 kg (203 lb 7.8 oz)   SpO2: 96% 96% 93% 93%    Intake/Output Summary (Last 24 hours) at 04/12/12 1058 Last data filed at 04/12/12 0900  Gross per 24 hour  Intake    240 ml  Output    600 ml  Net   -360 ml   Filed Weights   04/12/12 0304  Weight: 92.3 kg (203 lb 7.8 oz)    Exam:   General:  WD, elderly, caucasian male, lying in bed.  Appears weak  Cardiovascular: rrr no n/r/g, 1+ bilateral LE edema  Respiratory: CTA, no w/c/r  Abdomen: soft, nd, nt, +BS, no masses  Psych:  A&O, Cooperative, Moderately groomed., appropriate.  Data Reviewed: Basic Metabolic Panel:  Lab 04/12/12 8119 04/12/12 0145 04/11/12 1809 04/09/12 0530 04/08/12 1009 04/07/12 0750  NA 134* -- 136 137 139 139  K 3.3* -- 3.6 4.0 4.2 4.1  CL 100 -- 100 105 106 106  CO2 24 -- 25 25 25 22   GLUCOSE 88 -- 96 97 106* 140*  BUN 18 --  19 28* 36* 62*  CREATININE 1.18 1.31 1.26 1.27 1.48* --  CALCIUM 7.7* -- 8.1* 7.5* 8.1* 7.9*  MG -- -- -- -- -- --  PHOS -- -- -- -- -- --   Liver Function Tests:  Lab 04/05/12 1251  AST 62*  ALT 37  ALKPHOS 290*  BILITOT 0.8  PROT 6.1  ALBUMIN 2.2*   CBC:  Lab 04/12/12 0650 04/12/12 0145 04/11/12 1809 04/09/12 0530 04/08/12 1009  WBC 16.0* 18.4* 21.5* 19.6* 17.6*  NEUTROABS -- -- -- -- --  HGB 10.5* 11.6* 12.8* 12.0* 12.8*  HCT 30.8* 34.0* 37.4* 35.5* 37.7*  MCV 83.7 84.8 85.2 85.3 85.3  PLT 508* 509* 490* 224 183   Cardiac Enzymes:  Lab 04/06/12 2112 04/06/12 1529 04/06/12 1035 04/06/12 0227 04/05/12 2002  CKTOTAL 34 40 44 -- --  CKMB 4.1* 4.4* 4.4* -- --  CKMBINDEX -- -- -- -- --   TROPONINI <0.30 <0.30 0.30* 0.41* <0.30   CBG:  Lab 04/11/12 1723 04/05/12 2257  GLUCAP 95 80    Recent Results (from the past 240 hour(s))  URINE CULTURE     Status: Normal   Collection Time   04/03/12  6:15 PM      Component Value Range Status Comment   Specimen Description URINE, CLEAN CATCH   Final    Special Requests NONE   Final    Culture  Setup Time 04/04/2012 01:22   Final    Colony Count >=100,000 COLONIES/ML   Final    Culture ESCHERICHIA COLI   Final    Report Status 04/05/2012 FINAL   Final    Organism ID, Bacteria ESCHERICHIA COLI   Final   URINE CULTURE     Status: Normal   Collection Time   04/04/12  1:24 AM      Component Value Range Status Comment   Specimen Description URINE, RANDOM   Final    Special Requests NONE   Final    Culture  Setup Time 04/04/2012 02:38   Final    Colony Count 60,000 COLONIES/ML   Final    Culture ESCHERICHIA COLI   Final    Report Status 04/05/2012 FINAL   Final    Organism ID, Bacteria ESCHERICHIA COLI   Final   CULTURE, BLOOD (ROUTINE X 2)     Status: Normal   Collection Time   04/05/12 11:21 PM      Component Value Range Status Comment   Specimen Description BLOOD RIGHT HAND   Final    Special Requests BOTTLES DRAWN AEROBIC ONLY 10CC   Final    Culture  Setup Time 04/06/2012 04:35   Final    Culture NO GROWTH 5 DAYS   Final    Report Status 04/12/2012 FINAL   Final   CULTURE, BLOOD (ROUTINE X 2)     Status: Normal   Collection Time   04/05/12 11:21 PM      Component Value Range Status Comment   Specimen Description BLOOD RIGHT ARM   Final    Special Requests BOTTLES DRAWN AEROBIC ONLY 10CC   Final    Culture  Setup Time 04/06/2012 04:35   Final    Culture NO GROWTH 5 DAYS   Final    Report Status 04/12/2012 FINAL   Final      Studies: Ct Abdomen Pelvis Wo Contrast  04/11/2012  *RADIOLOGY REPORT*  Clinical Data: Recent urinary tract infection.  Now with weakness and fever.  Leukocytosis.  CT ABDOMEN AND PELVIS  WITHOUT  CONTRAST  Technique:  Multidetector CT imaging of the abdomen and pelvis was performed following the standard protocol without intravenous contrast.  Comparison: None.  Findings: Small bilateral pleural effusions, greater on the right. Atelectasis and scarring in the lung bases.  Multiple exophytic masses arising from both kidneys.  Some of these demonstrate increased density consistent with hemorrhagic cysts. Some lesions have low Hounsfield units consistent with cysts. There is however a 3.9 cm diameter lesion in the right lower pole which is indeterminate Hounsfield measurements and could represent hemorrhagic cyst or solid mass.  Recommend ultrasound or contrast enhanced CT of the kidneys to exclude solid lesions.  No renal or ureteral stones are demonstrated.  No pyelocaliectasis or ureterectasis.  There is infiltration in the pararenal fat bilaterally.  Changes suggest pyelonephritis.  No focal abscess demonstrated.  The bladder is somewhat decompressed with Foley catheter but the urine appears to have increased density suggesting complex fluid or blood.  The unenhanced appearance of the liver, spleen, contracted gallbladder, pancreas, adrenal glands, and retroperitoneal lymph nodes is unremarkable.  Calcification of the abdominal aorta without aneurysm.  The stomach and small bowel are decompressed. The colon is not abnormally distended.  No free air or free fluid in the abdomen.  Pelvis:  Seed implants in the prostate gland.  No definite pelvic lymphadenopathy.  No free or loculated pelvic fluid collections. Mild infiltration in the pelvic fat could represent inflammatory change or edema.  No evidence of diverticulitis.  The appendix is normal.  Normal degenerative changes throughout the thoracic spine. No focal bone lesions are appreciated.  IMPRESSION: Infiltration in the para renal fat bilaterally suggesting pyelonephritis.  No hydronephrosis.  Increased density of the urine suggesting blood or infected  urine.  Multiple bilateral renal mass lesions.  Most of these appear to represent cysts but there is an indeterminate lesion in the right kidney.  Contrast enhanced CT or ultrasound is recommended to exclude solid mass.   Original Report Authenticated By: Burman Nieves, M.D.     Scheduled Meds:    . amitriptyline  50 mg Oral QHS  . azelastine  1 spray Each Nare BID  . heparin  5,000 Units Subcutaneous Q8H  . levofloxacin  750 mg Oral Daily  . levothyroxine  100 mcg Oral QAC breakfast  . pantoprazole  40 mg Oral Daily  . potassium chloride  20 mEq Oral BID  . simvastatin  20 mg Oral q1800   Continuous Infusions:   Principal Problem:  *Weakness Active Problems:  HYPERTENSION  GERD   Ecoli UTI (urinary tract infection)  Nodule of left lung- 2.7 cm spiculated  Pyelonephritis  Urinary retention  Thyroid disease    Time spent: 35 min.    [place] [facility] Pager [phone]. If 8PM-8AM, contact night-coverage at [email or web address], password [facility] 04/12/2012, 10:58 AM  LOS: 1 day    Attending Patient seen and examined, agree with the above assessment and plan. Patient was recently discharged after being treated with acute pyelonephritis and leukemoid reaction,He was sent home based on PT recommendations. He unfortunately is deconditioned, and was unable to fully mobilize at home. He therefore returned to the hospital, he will likely require SNF. Still with smoldering pyelonephritis-c/w Levaquin.  S Ghimire

## 2012-04-13 ENCOUNTER — Telehealth: Payer: Self-pay | Admitting: Internal Medicine

## 2012-04-13 DIAGNOSIS — R911 Solitary pulmonary nodule: Secondary | ICD-10-CM

## 2012-04-13 DIAGNOSIS — E079 Disorder of thyroid, unspecified: Secondary | ICD-10-CM

## 2012-04-13 LAB — BASIC METABOLIC PANEL
GFR calc Af Amer: 63 mL/min — ABNORMAL LOW (ref >90)
GFR calc non Af Amer: 55 mL/min — ABNORMAL LOW (ref >90)
Potassium: 3.5 mEq/L (ref 3.5–5.1)
Sodium: 134 mEq/L — ABNORMAL LOW (ref 135–145)

## 2012-04-13 LAB — CBC
Hemoglobin: 10.9 g/dL — ABNORMAL LOW (ref 13.0–17.0)
Platelets: 616 10*3/uL — ABNORMAL HIGH (ref 150–400)
RBC: 3.83 MIL/uL — ABNORMAL LOW (ref 4.22–5.81)

## 2012-04-13 LAB — HEPATIC FUNCTION PANEL
AST: 74 U/L — ABNORMAL HIGH (ref 0–37)
Albumin: 1.9 g/dL — ABNORMAL LOW (ref 3.5–5.2)
Bilirubin, Direct: 0.4 mg/dL — ABNORMAL HIGH (ref 0.0–0.3)

## 2012-04-13 LAB — T4, FREE: Free T4: 1.12 ng/dL (ref 0.80–1.80)

## 2012-04-13 LAB — TSH: TSH: 6.106 u[IU]/mL — ABNORMAL HIGH (ref 0.350–4.500)

## 2012-04-13 MED ORDER — BOOST / RESOURCE BREEZE PO LIQD: 1.0000 | Freq: Three times a day (TID) | ORAL | Status: DC

## 2012-04-13 MED ORDER — TROLAMINE SALICYLATE 10 % EX CREA: TOPICAL_CREAM | Freq: Two times a day (BID) | CUTANEOUS | Status: DC | PRN

## 2012-04-13 MED ORDER — TAMSULOSIN HCL 0.4 MG PO CAPS: 0.4000 mg | ORAL_CAPSULE | Freq: Every day | ORAL | Status: DC

## 2012-04-13 MED ORDER — LEVOFLOXACIN 750 MG PO TABS: 750.0000 mg | ORAL_TABLET | Freq: Every day | ORAL | Status: DC

## 2012-04-13 MED ORDER — SALINE SPRAY 0.65 % NA SOLN: 1.0000 | NASAL | Status: DC | PRN

## 2012-04-13 NOTE — Care Management Note (Signed)
    Page 1 of 1   04/13/2012     2:44:34 PM   CARE MANAGEMENT NOTE 04/13/2012  Patient:  Scott [name], Scott [name]   Account Number:  [id number]  Date Initiated:  04/13/2012  Documentation initiated by:  Letha Cape  Subjective/Objective Assessment:   dx weakness  admit- lives alone.     Action/Plan:   pt eval- recs snf   Anticipated DC Date:  04/13/2012   Anticipated DC Plan:  SKILLED NURSING FACILITY  In-house referral  Clinical Social Worker      DC Planning Services  CM consult      Choice offered to / List presented to:             Status of service:  Completed, signed off Medicare Important Message given?   (If response is "NO", the following Medicare IM given date fields will be blank) Date Medicare IM given:   Date Additional Medicare IM given:    Discharge Disposition:  SKILLED NURSING FACILITY  Per UR Regulation:  Reviewed for med. necessity/level of care/duration of stay  If discussed at Long Length of Stay Meetings, dates discussed:    Comments:  04/13/12 14:43 Letha Cape RN, BSN [phone] patient lives alone, per physical therapy recs snf, CSW referral, patient for dc to [facility] today.

## 2012-04-13 NOTE — Telephone Encounter (Signed)
Please clarify regarding the f/u? Do we need to order PET, PFT? thanks

## 2012-04-13 NOTE — Progress Notes (Signed)
Spoke to MD.  Pt is leaving for SNF today.  MD asked to cancel order.  Pt to be evaluated at SNF.  Tory Emerald, [place] [phone]

## 2012-04-13 NOTE — Telephone Encounter (Signed)
Yes pleaes order PET and PFT. I had put this in my box for JC to order but the call came through triage. Pleaes order these for outpatient. I need to see him aftrer the tests < 3 weeks. If you in triage cannot order contact JC

## 2012-04-13 NOTE — Telephone Encounter (Signed)
Per Ward Chatters, PA called to leave this msg.  Per d/c instructions:  Follow up with RAMASWAMY,MURALI, MD. Schedule an appointment as soon as possible for a visit in 2 weeks. (Follow up for Spiculated Left Lung Nodule)   -----   MR, pls clarify the following: 1.  You do not have a HFU opening within the next 2 wks.  Are you ok with pt seeing another provider or TP? 2.  Are you ok with ordering PFT? 3.  Do you want to order PET? 4.  Please clarify what is needed regarding lung nodule bx so order can be placed.    Thank you.

## 2012-04-13 NOTE — Discharge Summary (Signed)
-   Agree with above continue Levaquin for 4 more days. - Discussed with urology we'll continue Foley and will be followed up as an outpatient on 04/28/2012. - CT scan concerning for malignancy: 2.7 cm spiculated left upper lobe nodule , with abnormal LFTs. We'll followup with pulmonary as an outpatient

## 2012-04-13 NOTE — Clinical Social Work Note (Signed)
CSW was consulted to complete discharge of patient. Pt to transfer to [facility] today via [facility]. GL [place] and family are aware of d/c. D/C packet complete with chart copy, signed FL2, and signed hard Rx.  CSW signing off as no other CSW needs identified at this time.  Lia Foyer, LCSWA [facility] Clinical Social Worker Contact #: [phone]

## 2012-04-13 NOTE — Telephone Encounter (Signed)
Already done

## 2012-04-13 NOTE — Clinical Social Work Psychosocial (Signed)
Clinical Social Work Department BRIEF PSYCHOSOCIAL ASSESSMENT 04/13/2012  Patient:  Scott [name], Scott [name]     Account Number:  [id number]     Admit date:  04/11/2012  Clinical Social Worker:  Johnsie Cancel  Date/Time:  04/12/2012 01:00 PM  Referred by:  Physician  Date Referred:  04/12/2012 Referred for  SNF Placement   Other Referral:   Interview type:  Patient Other interview type:   Daughter, Tommi Emery (cell [phone])    PSYCHOSOCIAL DATA Living Status:  ALONE Admitted from facility:   Level of care:   Primary support name:  Cardell Peach (daughter) Primary support relationship to patient:  CHILD, ADULT Degree of support available:   Adequate, at patient's bedside.    CURRENT CONCERNS Current Concerns  Post-Acute Placement   Other Concerns:    SOCIAL WORK ASSESSMENT / PLAN CSW consulted by MD re: SNF placement. CSW spoke to patient at bedside. CSW provided support on current hospitalization. CSW provided education on SNF process. Patient states he lives alone and wants to go to a facility in [facility]. CSW faxed out patient in [place]. CSW met with the patient's daughter when she visited patient at bedside. Daughter stated she will research facility and CSW will call at 1200 04/13/2012 for a decision. CSW will continue to follow patient.   Assessment/plan status:  Information/Referral to [facility] Other assessment/ plan:   Information/referral to community resources:   SNF List    PATIENT'S/FAMILY'S RESPONSE TO PLAN OF CARE: Patient and daughter thanked CSW for assisting in SNF process. CSW will continue to follow.    Lia Foyer, LCSWA [facility] Clinical Social Worker Contact #: [phone]

## 2012-04-13 NOTE — ED Provider Notes (Signed)
Medical screening examination/treatment/procedure(s) were conducted as a shared visit with non-physician practitioner(s) and myself.  I personally evaluated the patient during the encounter  Urine culture. abx now. Will admit for recurrent UTI and increasing weakness.  1. Pyelonephritis   2. Leukocytosis   3. UTI (urinary tract infection)   4. Weakness     Ct Abdomen Pelvis Wo Contrast  04/11/2012  *RADIOLOGY REPORT*  Clinical Data: Recent urinary tract infection.  Now with weakness and fever.  Leukocytosis.  CT ABDOMEN AND PELVIS WITHOUT CONTRAST  Technique:  Multidetector CT imaging of the abdomen and pelvis was performed following the standard protocol without intravenous contrast.  Comparison: None.  Findings: Small bilateral pleural effusions, greater on the right. Atelectasis and scarring in the lung bases.  Multiple exophytic masses arising from both kidneys.  Some of these demonstrate increased density consistent with hemorrhagic cysts. Some lesions have low Hounsfield units consistent with cysts. There is however a 3.9 cm diameter lesion in the right lower pole which is indeterminate Hounsfield measurements and could represent hemorrhagic cyst or solid mass.  Recommend ultrasound or contrast enhanced CT of the kidneys to exclude solid lesions.  No renal or ureteral stones are demonstrated.  No pyelocaliectasis or ureterectasis.  There is infiltration in the pararenal fat bilaterally.  Changes suggest pyelonephritis.  No focal abscess demonstrated.  The bladder is somewhat decompressed with Foley catheter but the urine appears to have increased density suggesting complex fluid or blood.  The unenhanced appearance of the liver, spleen, contracted gallbladder, pancreas, adrenal glands, and retroperitoneal lymph nodes is unremarkable.  Calcification of the abdominal aorta without aneurysm.  The stomach and small bowel are decompressed. The colon is not abnormally distended.  No free air or free fluid  in the abdomen.  Pelvis:  Seed implants in the prostate gland.  No definite pelvic lymphadenopathy.  No free or loculated pelvic fluid collections. Mild infiltration in the pelvic fat could represent inflammatory change or edema.  No evidence of diverticulitis.  The appendix is normal.  Normal degenerative changes throughout the thoracic spine. No focal bone lesions are appreciated.  IMPRESSION: Infiltration in the para renal fat bilaterally suggesting pyelonephritis.  No hydronephrosis.  Increased density of the urine suggesting blood or infected urine.  Multiple bilateral renal mass lesions.  Most of these appear to represent cysts but there is an indeterminate lesion in the right kidney.  Contrast enhanced CT or ultrasound is recommended to exclude solid mass.   Original Report Authenticated By: Burman Nieves, M.D.    Dg Chest 2 View  04/03/2012  *RADIOLOGY REPORT*  Clinical Data: Cough.  Fever.  Shortness of breath.  Generalized body aches.  Diarrhea.  Indwelling pacemaker.  CHEST - 2 VIEW  Comparison: Two-view chest x-ray 08/04/2007, 06/22/1006, 05/26/2004, 05/22/2003.  Findings: Cardiomediastinal silhouette unremarkable, unchanged. Left subclavian dual lead transvenous pacemaker unchanged and appears intact.  Scarring in the lower lobes, unchanged.  Lungs otherwise clear.  Stable mild hyperinflation.  No visible pleural effusions.  Degenerative changes involving the thoracic spine.  No significant interval change.  IMPRESSION: Stable mild hyperinflation consistent with COPD and/or asthma. Stable scarring in the lower lobes.  No acute cardiopulmonary disease.   Original Report Authenticated By: Hulan Saas, M.D.    Ct Chest Wo Contrast  04/06/2012  *RADIOLOGY REPORT*  Clinical Data: Left lung nodule.  CT CHEST WITHOUT CONTRAST  Technique:  Multidetector CT imaging of the chest was performed following the standard protocol without IV contrast.  Comparison: 04/06/2012  Findings:  Spiculated 2.7 x 2.3  cm nodule in the left upper lobe on image 27 of series 3 is suspicious for malignancy.  Several bronchograms noted along its margin. There is oblique truncation and airway extending towards this nodule on image 28 of series 3.  Dependent atelectasis noted in both lungs with airway thickening and mild lower lobe airway plugging.  Dual lead cardiac pacer noted.  There is evidence of coronary artery atherosclerotic calcification of the left anterior descending coronary artery.  A 1.6 x 1.4 cm structure with internal density of 30 1 HU on image 57 of series 2 could represent the top of a left renal lesion or an accessory spleen.  The upper margins of the adrenal glands appear unremarkable, with lower margins not included on today's CT scan.  No cardiomegaly observed.  No pathologic thoracic adenopathy noted on today's noncontrast exam.  Old bilateral rib fractures noted. Thoracic spondylosis is present.  IMPRESSION:  1.  2.7 cm spiculated left upper lobe nodule is suspicious for malignancy.  Entities such as fungal pneumonia or tuberculosis are considered less likely.  Tissue diagnosis recommended - should be minimal to bronchoscopic approach. 2.  Mild lower lobe airway thickening with some airway plugging and volume loss in the lower lobes. 3.  Coronary artery atherosclerosis. 4.  Nonspecific soft tissue nodular density in the left upper quadrant posteriorly could represent an exophytic renal lesion or an accessory spleen.  The left kidney upper pole cyst shown on prior ultrasound from 04/04/2012 does not appear particularly complex whereas this lesion does have higher than fluid density. This could be further investigated with dedicated CT of the abdomen if clinically warranted.   Original Report Authenticated By: Gaylyn Rong, M.D.    US Renal  04/04/2012  *RADIOLOGY REPORT*  Clinical Data: Acute renal failure.  RENAL/URINARY TRACT ULTRASOUND COMPLETE  Comparison:  None.  Findings:  Right Kidney:  14.0 cm.   No hydronephrosis.  Three right renal cysts largest measuring up to 3.4 cm.  Left Kidney:  13.8 cm.  No hydronephrosis. 1.4 cm left upper pole cyst.  The lower pole 2.3 cm rounded structure may be a slightly complex cyst but cannot be confirmed as a simple cyst.  Bladder:  Foley catheter in place and decompressed.  Impression:  No evidence hydronephrosis.  Bilateral renal cysts.  Left lower pole 2.3 cm structure may represent a slightly complex cyst.  This cannot be confirmed as a simple cyst on the present exam.   Original Report Authenticated By: Lacy Duverney, M.D.       Lyanne Co, MD 04/13/12 [id number]

## 2012-04-13 NOTE — Telephone Encounter (Signed)
I was waiting for this patient again discharged before I sent this message because I cannot do orders until the patient gets discharged because of the inpatient outpatient epic confusion.  He needs PET scan as an outpatient. I could not to this order and while he is an inpatient because this test is not done for inpatient. Please put an order once the patient is discharged.  He also needs full pulmonary function test. Please put an order  He needs to see me after both within the next 2-3 weeks   Dr. Kalman Shan, M.D., [facility].C.P Pulmonary and Critical Care Medicine Staff Physician [facility] Pager: [phone], If no answer or between  15:00h - 7:00h: call [phone]  04/13/2012 4:10 PM

## 2012-04-13 NOTE — Discharge Summary (Signed)
Physician Discharge Summary  Scott [name] AOZ:[id number] DOB: 1925-12-26 DOA: 04/11/2012  PCP: Scott Corwin, MD  Admit date: 04/11/2012 Discharge date: 04/13/2012  Time spent: 45 minutes  Recommendations for Outpatient Follow-up: regular diet  CBC & BMET in 3 - 5 days to check Creatinine, WBC, HGB, Calcium levels. LFTs in 3 - 5 days.  Bilirubin was trending up. Alk Phos was elevated but is trending down.  Patient has a spiculated 2.7 cm left lung nodule suspicious for malignancy.  Scott. Jane [name] office at [facility] will call with appointments for PET scan, Pulmonary Function Testing and a biopsy.  If they do not call with in a few days, please call them to follow up.  Scott [name] will be discharged with Foley Cath in place.  Patient failed a voiding trial on 04/09/12 and the foley was replaced that day at discharge.  He came back to the hospital on 2/3 and we have left the foley in place.  He will see Scott. Mena [name] on 2/20 for acute urinary retention with recent history of pyelo- nephritis and distant history of prostate cancer.     Discharge Diagnoses:  Principal Problem:  *Pyelonephritis Active Problems:   Ecoli UTI (urinary tract infection)  Nodule of left lung- 2.7 cm spiculated  Urinary retention  HYPERTENSION  GERD  Weakness  Thyroid disease  History of prostate cancer  History of throat cancer  Hypoalbuminemia   Discharge Condition: improved.  Foley still in place  Diet recommendation: regular diet  Filed Weights   04/12/12 0304  Weight: 92.3 kg (203 lb 7.8 oz)    History of present illness:  Scott [name] is a 79 y.o. male who was just discharged from the hospital 2 days ago and is recovering from pyleonephritis. Unfortunately while his generalized weakness has been improving since arriving at home he is not able to get up and ambulate at this point in time, as a result his family has called his PCP who sent him back to the ED to be evaluated.  In  the ED, his WBC while still elevated is markedly down since his prior admission (when it was over 60k).   Hospital Course:  Ecoli UTI and pyleonephritis -  Sensitive to levaquin. He was receiving Levaquin 250 mg daily.  Dosage increased to 750 daily on 2/4. Will continue oral Levaquin for 4 more days after discharge 04/13/12. Clinically he is recovering well (WBC trending down, afebrile) but he is still too weak to stand without assistance.   Urinary retention with a history of prostate CA  Patient unable to void sufficiently at discharge 2/1.  Foley Catheter left in place and was continued thru this admission.  He has been started on flomax this admission.   He has a scheduled urology appointment on 2/20 at [facility].   Elevated LFTs Bilirubin rising (Liver not abnormal on CT Abd/Pelvis this admit - gall bladder contracted).  Increased urobilinogen.  Alk Phos and AST also elevated (2x normal).  Patient asymptomatic.  Stable for outpatient follow up.  Hypoalbuminemia Started patient on ensure supplementation.  Weakness  Physical deconditioning due to recent acute illness.  Patient unable to mobilize or stand up.  PT recommends SNF.   Thyroid Disease  History of hypothyroidism.   On levothyroxine. Given current weakness will check a TSH, Free T4 these were drawn prior to discharge but have not yet resulted.     HTN  WNL not on BP meds.   GERD  On protonix  H/O Complete heart block with PPM ([employer]). Stable   Spiculated nodule of left lung  From 1/26 d/c summary: CT chest shows 2.7cm spiculated LUL nodule suspicious for malignancy, will need tissue diagnosis. Discussed with pulmonology, per Scott [name] (PCCM), given cardiac issues, recovering renal function, sepsis, it can be done out-patient with PET scan first. Scott [name] will make arrangements for PET scan and PFT's prior to the biopsy.     H/O throat cancer. Presumed stable. No complaints.  Discharge Exam: Filed Vitals:    04/12/12 0520 04/12/12 1430 04/12/12 2124 04/13/12 0539  BP: 115/63 131/76 135/67 118/67  Pulse: 106 100 94 92  Temp: 99.7 F (37.6 C) 98.1 F (36.7 C) 98.2 F (36.8 C) 98.2 F (36.8 C)  TempSrc: Axillary Oral Oral Oral  Resp: 17 18 18 20   Height:      Weight:      SpO2: 93% 94% 100% 91%    General: Elderly caucasian male appears comfortable.  NAD Cardiovascular: RRR, no M/R/G, minimal lower extremity edema. Respiratory: CTA no W/C/R, no accessory muscle use. Abdomen:  Soft, NT, ND, +BS, No Masses Skin:  No rashes, lesions, bruising.  Discharge Instructions      Discharge Orders    Future Orders Please Complete By Expires   Diet - low sodium heart healthy      Increase activity slowly          Medication List     As of 04/13/2012 11:31 AM    STOP taking these medications         ASTELIN 137 MCG/SPRAY nasal spray   Generic drug: azelastine      BC HEADACHE POWDER PO      TAKE these medications         amitriptyline 50 MG tablet   Commonly known as: ELAVIL   Take 50 mg by mouth at bedtime.      feeding supplement Liqd   Take 1 Container by mouth 3 (three) times daily between meals.      guaiFENesin-dextromethorphan 100-10 MG/5ML syrup   Commonly known as: ROBITUSSIN DM   Take 5 mLs by mouth every 4 (four) hours as needed for cough.      lansoprazole 30 MG capsule   Commonly known as: PREVACID   Take 30 mg by mouth daily.      levofloxacin 750 MG tablet   Commonly known as: LEVAQUIN   Take 1 tablet (750 mg total) by mouth daily.      levothyroxine 100 MCG tablet   Commonly known as: SYNTHROID, LEVOTHROID   Take 100 mcg by mouth daily.      lidocaine 4 % cream   Commonly known as: LMX   Apply topically daily as needed (pain).      pravastatin 40 MG tablet   Commonly known as: PRAVACHOL   Take 40 mg by mouth daily.      sodium chloride 0.65 % Soln nasal spray   Commonly known as: OCEAN   Place 1 spray into the nose as needed for congestion.       Tamsulosin HCl 0.4 MG Caps   Commonly known as: FLOMAX   Take 1 capsule (0.4 mg total) by mouth daily.      trolamine salicylate 10 % cream   Commonly known as: ASPERCREME   Apply topically 2 (two) times daily as needed.      zolpidem 5 MG tablet   Commonly known as: AMBIEN   Take 1 tablet (5  mg total) by mouth at bedtime as needed for sleep.         Follow-up Information    Follow up with Antony Haste, MD. On 04/28/2012. (For Urinary retention to remove Foley Cath)    Contact information:   [address] [place] [address] [phone]       Follow up with MCKEOWN,WILLIAM DAVID, MD. Schedule an appointment as soon as possible for a visit in 3 days.      Follow up with RAMASWAMY,MURALI, MD. Schedule an appointment as soon as possible for a visit in 2 weeks. (Follow up for Spiculated Left Lung Nodule)    Contact information:   [address] [place] [address] [phone]           The results of significant diagnostics from this hospitalization (including imaging, microbiology, ancillary and laboratory) are listed below for reference.    Significant Diagnostic Studies: Ct Abdomen Pelvis Wo Contrast  04/11/2012  *RADIOLOGY REPORT*  Clinical Data: Recent urinary tract infection.  Now with weakness and fever.  Leukocytosis.  CT ABDOMEN AND PELVIS WITHOUT CONTRAST  Technique:  Multidetector CT imaging of the abdomen and pelvis was performed following the standard protocol without intravenous contrast.  Comparison: None.  Findings: Small bilateral pleural effusions, greater on the right. Atelectasis and scarring in the lung bases.  Multiple exophytic masses arising from both kidneys.  Some of these demonstrate increased density consistent with hemorrhagic cysts. Some lesions have low Hounsfield units consistent with cysts. There is however a 3.9 cm diameter lesion in the right lower pole which is indeterminate Hounsfield measurements and could  represent hemorrhagic cyst or solid mass.  Recommend ultrasound or contrast enhanced CT of the kidneys to exclude solid lesions.  No renal or ureteral stones are demonstrated.  No pyelocaliectasis or ureterectasis.  There is infiltration in the pararenal fat bilaterally.  Changes suggest pyelonephritis.  No focal abscess demonstrated.  The bladder is somewhat decompressed with Foley catheter but the urine appears to have increased density suggesting complex fluid or blood.  The unenhanced appearance of the liver, spleen, contracted gallbladder, pancreas, adrenal glands, and retroperitoneal lymph nodes is unremarkable.  Calcification of the abdominal aorta without aneurysm.  The stomach and small bowel are decompressed. The colon is not abnormally distended.  No free air or free fluid in the abdomen.  Pelvis:  Seed implants in the prostate gland.  No definite pelvic lymphadenopathy.  No free or loculated pelvic fluid collections. Mild infiltration in the pelvic fat could represent inflammatory change or edema.  No evidence of diverticulitis.  The appendix is normal.  Normal degenerative changes throughout the thoracic spine. No focal bone lesions are appreciated.  IMPRESSION: Infiltration in the para renal fat bilaterally suggesting pyelonephritis.  No hydronephrosis.  Increased density of the urine suggesting blood or infected urine.  Multiple bilateral renal mass lesions.  Most of these appear to represent cysts but there is an indeterminate lesion in the right kidney.  Contrast enhanced CT or ultrasound is recommended to exclude solid mass.   Original Report Authenticated By: Burman Nieves, M.D.    Dg Chest 2 View  04/03/2012  *RADIOLOGY REPORT*  Clinical Data: Cough.  Fever.  Shortness of breath.  Generalized body aches.  Diarrhea.  Indwelling pacemaker.  CHEST - 2 VIEW  Comparison: Two-view chest x-ray 08/04/2007, 06/22/1006, 05/26/2004, 05/22/2003.  Findings: Cardiomediastinal silhouette unremarkable,  unchanged. Left subclavian dual lead transvenous pacemaker unchanged and appears intact.  Scarring in the  lower lobes, unchanged.  Lungs otherwise clear.  Stable mild hyperinflation.  No visible pleural effusions.  Degenerative changes involving the thoracic spine.  No significant interval change.  IMPRESSION: Stable mild hyperinflation consistent with COPD and/or asthma. Stable scarring in the lower lobes.  No acute cardiopulmonary disease.   Original Report Authenticated By: Hulan Saas, M.D.    Ct Chest Wo Contrast  04/06/2012  *RADIOLOGY REPORT*  Clinical Data: Left lung nodule.  CT CHEST WITHOUT CONTRAST  Technique:  Multidetector CT imaging of the chest was performed following the standard protocol without IV contrast.  Comparison: 04/06/2012  Findings: Spiculated 2.7 x 2.3 cm nodule in the left upper lobe on image 27 of series 3 is suspicious for malignancy.  Several bronchograms noted along its margin. There is oblique truncation and airway extending towards this nodule on image 28 of series 3.  Dependent atelectasis noted in both lungs with airway thickening and mild lower lobe airway plugging.  Dual lead cardiac pacer noted.  There is evidence of coronary artery atherosclerotic calcification of the left anterior descending coronary artery.  A 1.6 x 1.4 cm structure with internal density of 30 1 HU on image 57 of series 2 could represent the top of a left renal lesion or an accessory spleen.  The upper margins of the adrenal glands appear unremarkable, with lower margins not included on today's CT scan.  No cardiomegaly observed.  No pathologic thoracic adenopathy noted on today's noncontrast exam.  Old bilateral rib fractures noted. Thoracic spondylosis is present.  IMPRESSION:  1.  2.7 cm spiculated left upper lobe nodule is suspicious for malignancy.  Entities such as fungal pneumonia or tuberculosis are considered less likely.  Tissue diagnosis recommended - should be minimal to bronchoscopic  approach. 2.  Mild lower lobe airway thickening with some airway plugging and volume loss in the lower lobes. 3.  Coronary artery atherosclerosis. 4.  Nonspecific soft tissue nodular density in the left upper quadrant posteriorly could represent an exophytic renal lesion or an accessory spleen.  The left kidney upper pole cyst shown on prior ultrasound from 04/04/2012 does not appear particularly complex whereas this lesion does have higher than fluid density. This could be further investigated with dedicated CT of the abdomen if clinically warranted.   Original Report Authenticated By: Gaylyn Rong, M.D.    US Renal  04/04/2012  *RADIOLOGY REPORT*  Clinical Data: Acute renal failure.  RENAL/URINARY TRACT ULTRASOUND COMPLETE  Comparison:  None.  Findings:  Right Kidney:  14.0 cm.  No hydronephrosis.  Three right renal cysts largest measuring up to 3.4 cm.  Left Kidney:  13.8 cm.  No hydronephrosis. 1.4 cm left upper pole cyst.  The lower pole 2.3 cm rounded structure may be a slightly complex cyst but cannot be confirmed as a simple cyst.  Bladder:  Foley catheter in place and decompressed.  Impression:  No evidence hydronephrosis.  Bilateral renal cysts.  Left lower pole 2.3 cm structure may represent a slightly complex cyst.  This cannot be confirmed as a simple cyst on the present exam.   Original Report Authenticated By: Lacy Duverney, M.D.    Dg Chest Port 1 View  04/06/2012  *RADIOLOGY REPORT*  Clinical Data: Fever  PORTABLE CHEST - 1 VIEW  Comparison: Chest radiograph 01/25 1014  Findings: Left-sided pacemaker overlies stable cardiac silhouette. There is an ill-defined 2.5 cm opacity in the left mid lung.  There is a trace right pleural effusion.  No pulmonary edema.  No pneumothorax.  No aggressive osseous lesions.  IMPRESSION:  Left lung opacity is incompletely characterized.  This could represent a focus of neoplasm or infection.    Recommend CT thorax with contrast for further evaluation.  This  was made a call report.   Original Report Authenticated By: Genevive Bi, M.D.     Microbiology: Recent Results (from the past 240 hour(s))  URINE CULTURE     Status: Normal   Collection Time   04/03/12  6:15 PM      Component Value Range Status Comment   Specimen Description URINE, CLEAN CATCH   Final    Special Requests NONE   Final    Culture  Setup Time 04/04/2012 01:22   Final    Colony Count >=100,000 COLONIES/ML   Final    Culture ESCHERICHIA COLI   Final    Report Status 04/05/2012 FINAL   Final    Organism ID, Bacteria ESCHERICHIA COLI   Final   URINE CULTURE     Status: Normal   Collection Time   04/04/12  1:24 AM      Component Value Range Status Comment   Specimen Description URINE, RANDOM   Final    Special Requests NONE   Final    Culture  Setup Time 04/04/2012 02:38   Final    Colony Count 60,000 COLONIES/ML   Final    Culture ESCHERICHIA COLI   Final    Report Status 04/05/2012 FINAL   Final    Organism ID, Bacteria ESCHERICHIA COLI   Final   CULTURE, BLOOD (ROUTINE X 2)     Status: Normal   Collection Time   04/05/12 11:21 PM      Component Value Range Status Comment   Specimen Description BLOOD RIGHT HAND   Final    Special Requests BOTTLES DRAWN AEROBIC ONLY 10CC   Final    Culture  Setup Time 04/06/2012 04:35   Final    Culture NO GROWTH 5 DAYS   Final    Report Status 04/12/2012 FINAL   Final   CULTURE, BLOOD (ROUTINE X 2)     Status: Normal   Collection Time   04/05/12 11:21 PM      Component Value Range Status Comment   Specimen Description BLOOD RIGHT ARM   Final    Special Requests BOTTLES DRAWN AEROBIC ONLY 10CC   Final    Culture  Setup Time 04/06/2012 04:35   Final    Culture NO GROWTH 5 DAYS   Final    Report Status 04/12/2012 FINAL   Final   URINE CULTURE     Status: Normal   Collection Time   04/11/12  5:27 PM      Component Value Range Status Comment   Specimen Description URINE, CLEAN CATCH   Final    Special Requests NONE   Final     Culture  Setup Time 04/11/2012 18:22   Final    Colony Count NO GROWTH   Final    Culture NO GROWTH   Final    Report Status 04/12/2012 FINAL   Final   URINE CULTURE     Status: Normal   Collection Time   04/11/12  8:43 PM      Component Value Range Status Comment   Specimen Description URINE, CATHETERIZED   Final    Special Requests CX ADDED AT 2124 ON [id number]   Final    Culture  Setup Time 04/11/2012 21:55   Final    Colony Count  NO GROWTH   Final    Culture NO GROWTH   Final    Report Status 04/12/2012 FINAL   Final      Labs: Basic Metabolic Panel:  Lab 04/13/12 1610 04/12/12 0650 04/12/12 0145 04/11/12 1809 04/09/12 0530 04/08/12 1009  NA 134* 134* -- 136 137 139  K 3.5 3.3* -- 3.6 4.0 4.2  CL 99 100 -- 100 105 106  CO2 24 24 -- 25 25 25   GLUCOSE 83 88 -- 96 97 106*  BUN 18 18 -- 19 28* 36*  CREATININE 1.18 1.18 1.31 1.26 1.27 --  CALCIUM 8.2* 7.7* -- 8.1* 7.5* 8.1*  MG -- -- -- -- -- --  PHOS -- -- -- -- -- --   Liver Function Tests:  Lab 04/13/12 0630  AST 74*  ALT 41  ALKPHOS 184*  BILITOT 1.3*  PROT 6.2  ALBUMIN 1.9*   CBC:  Lab 04/13/12 0630 04/12/12 0650 04/12/12 0145 04/11/12 1809 04/09/12 0530  WBC 12.2* 16.0* 18.4* 21.5* 19.6*  NEUTROABS -- -- -- -- --  HGB 10.9* 10.5* 11.6* 12.8* 12.0*  HCT 32.7* 30.8* 34.0* 37.4* 35.5*  MCV 85.4 83.7 84.8 85.2 85.3  PLT 616* 508* 509* 490* 224   Cardiac Enzymes:  Lab 04/06/12 2112 04/06/12 1529  CKTOTAL 34 40  CKMB 4.1* 4.4*  CKMBINDEX -- --  TROPONINI <0.30 <0.30   CBG:  Lab 04/11/12 1723  GLUCAP 95     Signed:York, Antonietta Jewel  [facility] [phone] 04/13/2012, 11:31 AM

## 2012-04-13 NOTE — Progress Notes (Signed)
NURSING PROGRESS NOTE  [facility] [id number] Discharge Data: 04/13/2012 2:46 PM Attending Provider: No att. providers found NWG:NFAOZHY,QMVHQIO DAVID, MD     Rhoderick Salome Arnt to be D/C'd Skilled nursing facility per MD order. All IV's discontinued with no bleeding noted. All belongings returned to patient for patient to take home.   Last Vital Signs:  Blood pressure 118/67, pulse 92, temperature 98.2 F (36.8 C), temperature source Oral, resp. rate 20, height 6' (1.829 m), weight 92.3 kg (203 lb 7.8 oz), SpO2 91.00%.  Discharge Medication List   Medication List     As of 04/13/2012  2:46 PM    STOP taking these medications         ASTELIN 137 MCG/SPRAY nasal spray   Generic drug: azelastine      BC HEADACHE POWDER PO      TAKE these medications         amitriptyline 50 MG tablet   Commonly known as: ELAVIL   Take 50 mg by mouth at bedtime.      feeding supplement Liqd   Take 1 Container by mouth 3 (three) times daily between meals.      guaiFENesin-dextromethorphan 100-10 MG/5ML syrup   Commonly known as: ROBITUSSIN DM   Take 5 mLs by mouth every 4 (four) hours as needed for cough.      lansoprazole 30 MG capsule   Commonly known as: PREVACID   Take 30 mg by mouth daily.      levofloxacin 750 MG tablet   Commonly known as: LEVAQUIN   Take 1 tablet (750 mg total) by mouth daily.      levothyroxine 100 MCG tablet   Commonly known as: SYNTHROID, LEVOTHROID   Take 100 mcg by mouth daily.      lidocaine 4 % cream   Commonly known as: LMX   Apply topically daily as needed (pain).      pravastatin 40 MG tablet   Commonly known as: PRAVACHOL   Take 40 mg by mouth daily.      sodium chloride 0.65 % Soln nasal spray   Commonly known as: OCEAN   Place 1 spray into the nose as needed for congestion.      Tamsulosin HCl 0.4 MG Caps   Commonly known as: FLOMAX   Take 1 capsule (0.4 mg total) by mouth daily.      trolamine salicylate 10 % cream   Commonly known  as: ASPERCREME   Apply topically 2 (two) times daily as needed.      zolpidem 5 MG tablet   Commonly known as: AMBIEN   Take 1 tablet (5 mg total) by mouth at bedtime as needed for sleep.        Sejla Marzano, Elmarie Mainland, RN

## 2012-04-18 NOTE — Telephone Encounter (Signed)
Duplicate message. Scott [name], CMA  

## 2012-04-18 NOTE — Telephone Encounter (Signed)
Order placed for PET scan and PFT. LMTCBx1 to schedule with the pt. Pt also needs HFU set for 2-3 weeks after tests. Carron Curie, CMA

## 2012-04-19 NOTE — Telephone Encounter (Signed)
LMTCBx2. Kimberli Winne, CMA  

## 2012-04-25 ENCOUNTER — Telehealth: Payer: Self-pay | Admitting: Internal Medicine

## 2012-04-25 NOTE — Telephone Encounter (Signed)
He is having pet scan 04/28/12/. pls give him fu to see me after that

## 2012-04-26 NOTE — Telephone Encounter (Signed)
Duplicate. Jennifer Castillo, CMA  

## 2012-04-26 NOTE — Telephone Encounter (Signed)
Lmtcbx3. Scott [name], CMA  

## 2012-04-28 ENCOUNTER — Encounter (HOSPITAL_COMMUNITY)
Admission: RE | Admit: 2012-04-28 | Discharge: 2012-04-28 | Disposition: A | Discharge status: Home / Self Care | Payer: Medicare Other | Source: Ambulatory Visit | Attending: Internal Medicine | Admitting: Internal Medicine

## 2012-04-28 ENCOUNTER — Encounter (HOSPITAL_COMMUNITY): Payer: Self-pay

## 2012-04-28 ENCOUNTER — Telehealth: Payer: Self-pay | Admitting: Internal Medicine

## 2012-04-28 DIAGNOSIS — C3490 Malignant neoplasm of unspecified part of unspecified bronchus or lung: Secondary | ICD-10-CM | POA: Insufficient documentation

## 2012-04-28 DIAGNOSIS — R911 Solitary pulmonary nodule: Secondary | ICD-10-CM

## 2012-04-28 DIAGNOSIS — N289 Disorder of kidney and ureter, unspecified: Secondary | ICD-10-CM | POA: Insufficient documentation

## 2012-04-28 DIAGNOSIS — R599 Enlarged lymph nodes, unspecified: Secondary | ICD-10-CM | POA: Insufficient documentation

## 2012-04-28 DIAGNOSIS — R222 Localized swelling, mass and lump, trunk: Secondary | ICD-10-CM | POA: Insufficient documentation

## 2012-04-28 LAB — GLUCOSE, CAPILLARY: Glucose-Capillary: 105 mg/dL — ABNORMAL HIGH (ref 70–99)

## 2012-04-28 MED ORDER — FLUDEOXYGLUCOSE F - 18 (FDG) INJECTION
17.8000 | Freq: Once | INTRAVENOUS | Status: AC | PRN
  Administered 2012-04-28: 17.8 via INTRAVENOUS

## 2012-04-28 NOTE — Telephone Encounter (Signed)
Nm Pet Image Initial (pi) Skull Base To Thigh  04/28/2012  *RADIOLOGY REPORT*  Clinical Data: Initial treatment strategy for lung cancer.  NUCLEAR MEDICINE PET SKULL BASE TO THIGH  Fasting Blood Glucose:  105  Technique:  17.8 mCi F-18 FDG was injected intravenously. CT data was obtained and used for attenuation correction and anatomic localization only.  (This was not acquired as a diagnostic CT examination.) Additional exam technical data entered on technologist worksheet.  Comparison:  CT chest from 04/06/2012  Findings:  Neck: No hypermetabolic lymph node or mass within the soft tissues of the neck.  There is diffuse uniform tracer uptake throughout both lobes of the thyroid gland.  Chest:  Within the left upper lobe there is a mass measuring 2.6 x 3.2 x 3.7 cm, image 96.  This has intense FDG uptake within SUV max equal to 18.1.  No hypermetabolic mediastinal or hilar lymph nodes identified.  Abdomen/Pelvis:  No abnormal hypermetabolic activity within the liver, pancreas, adrenal glands, or spleen.  Bilateral complex renal cysts are identified which demonstrate peripheral increased FDG uptake.  Cyst within the midpole of the right kidney measures 3.6 cm and has an associated SUV max equal to 7.5, image 165. Arising from the inferior pole of the right kidney is a complicated cyst measuring 2 cm.  This has an SUV max equal to 6.1, image 169. The urinary bladder is filled with debris and gas.  There is a Foley catheter balloon noted.  Seed implants noted within the prostate gland.  No hypermetabolic lymph nodes in the abdomen or pelvis.  Skeleton:  There is abnormal asymmetric increased radiotracer uptake associated with the right to first costochondral junction. The SUV max in this area is equal to 8.8, image number 68. Associated enlarged right internal mammary lymph nodes are identified.  Index right internal mammary node measures 1.2 cm and has an SUV max equal to 6.8.  IMPRESSION:  1.  Mass within the left  upper lobe is intensely hypermetabolic and is concerning for primary lung neoplasm.  Correlation with biopsy findings is advised. 2.  There are bilateral renal lesions which exhibit peripheral increased FDG uptake. Findings may represent infection or tumor. This is an atypical finding for a simple renal cyst and further evaluation with contrast enhanced MRI is suggested. 3.  Abnormal malignant range increased radiotracer uptake is associated with the first right costochondral junction. There is also associated enlargement of the right internal mammary lymph node which also exhibits increased FDG uptake.  No focal bone changes are identified on the corresponding CT images. Differential for this appearance includes metastatic disease versus infection. 4.  Diffuse increased radiotracer uptake identified throughout the thyroid gland.  This is typically seen with thyroiditis.  Consider correlation with patient's TSH level.   Original Report Authenticated By: Signa Kell, M.D.    Results given to daugher and son  Patient at [facility]. We have to get him in for face to face  MR

## 2012-04-29 ENCOUNTER — Institutional Professional Consult (permissible substitution): Payer: Medicare Other | Admitting: Internal Medicine

## 2012-04-29 ENCOUNTER — Telehealth: Payer: Self-pay | Admitting: Internal Medicine

## 2012-04-29 NOTE — Telephone Encounter (Signed)
lmm @ 303pm for pt to call to set up pacer check due in march per recall/mt

## 2012-04-29 NOTE — Telephone Encounter (Signed)
I might have already sent to another message. He lives in [facility]. Please get him in to discuss PET scan first week of March 2014. A overbook if needed

## 2012-05-02 NOTE — Telephone Encounter (Signed)
LMTCBx1 with the pt son to schedule appt. Carron Curie, CMA

## 2012-05-03 NOTE — Telephone Encounter (Signed)
LMTCBx2. Kamarrion Stfort, CMA  

## 2012-05-03 NOTE — Telephone Encounter (Signed)
Please call daughter(Gay)--[phone] or [phone]

## 2012-05-03 NOTE — Telephone Encounter (Signed)
LM for daughter Cardell Peach to call at [phone].  No answer at [phone].

## 2012-05-06 NOTE — Telephone Encounter (Signed)
MCKEOWN,WILLIAM DAVID, MD is pcp    Spoke to her daughter over the phone. Patient is now home and is independent with capacity. According to the daughter patient is fearful of radiation treatment and therefore has refused a followup. This is based on prior experience several years ago when his brother died after radiation. However, I explained to the daughter that we do not know what type of possible malignancy this is and what the treatment is and if radiation will be needed at all. In even radiation as needed moderate radiation is different. Daughter was receptive to all this but did not want me to communicate this with the patient as yet. She feels that I should talk to the patient primary care physician who with the patient has a lot of confidence with  Therefore I will forward this to patient primary care physician and we will attempt to contact him  Victorino Dike, please get hold of primary-care physician physician but am in the office week of 05/09/2012   Dr. Kalman Shan, M.D., F.C.C.P Pulmonary and Critical Care Medicine Staff Physician [facility] Pager: [phone], If no answer or between  15:00h - 7:00h: call [phone]  05/06/2012 1:44 PM

## 2012-05-06 NOTE — Telephone Encounter (Signed)
I spoke with pt daughter and advised we needed to get the pt scheduled to see MR. She states the pt has decided that he wants see his PCP first and then decide what to do from there. He does not want to schedule an appt at this time with [place]. I advised I will let MR know this. Carron Curie, CMA

## 2012-05-19 ENCOUNTER — Ambulatory Visit (INDEPENDENT_AMBULATORY_CARE_PROVIDER_SITE_OTHER): Payer: Medicare Other | Admitting: Cardiology

## 2012-05-19 ENCOUNTER — Encounter: Payer: Self-pay | Admitting: Internal Medicine

## 2012-05-19 VITALS — Ht 72.0 in | Wt 191.0 lb

## 2012-05-19 DIAGNOSIS — I442 Atrioventricular block, complete: Secondary | ICD-10-CM

## 2012-05-19 DIAGNOSIS — Z950 Presence of cardiac pacemaker: Secondary | ICD-10-CM

## 2012-05-19 LAB — PACEMAKER DEVICE OBSERVATION
AL AMPLITUDE: 3.7 mv
AL THRESHOLD: 1 V
BATTERY VOLTAGE: 2.71 V
DEVICE MODEL PM: 971081
RV LEAD THRESHOLD: 0.875 V
VENTRICULAR PACING PM: 100

## 2012-05-19 NOTE — Progress Notes (Signed)
PPM check/device clinic visit. Normal PPM function. See PaceArt report.

## 2012-12-27 ENCOUNTER — Ambulatory Visit (INDEPENDENT_AMBULATORY_CARE_PROVIDER_SITE_OTHER): Payer: Medicare Other | Admitting: Internal Medicine

## 2012-12-27 ENCOUNTER — Encounter: Payer: Self-pay | Admitting: Internal Medicine

## 2012-12-27 VITALS — BP 141/79 | HR 69 | Ht 72.0 in | Wt 187.1 lb

## 2012-12-27 DIAGNOSIS — I10 Essential (primary) hypertension: Secondary | ICD-10-CM

## 2012-12-27 DIAGNOSIS — Z950 Presence of cardiac pacemaker: Secondary | ICD-10-CM

## 2012-12-27 DIAGNOSIS — I442 Atrioventricular block, complete: Secondary | ICD-10-CM

## 2012-12-27 LAB — PACEMAKER DEVICE OBSERVATION
BAMS-0001: 170 {beats}/min
BAMS-0003: 70 {beats}/min
BATTERY VOLTAGE: 2.64 V

## 2012-12-27 NOTE — Assessment & Plan Note (Signed)
His blood pressure is well controlled. No change in medical therapy. I've encouraged the patient to maintain a low-sodium diet.

## 2012-12-27 NOTE — Progress Notes (Signed)
HPI Scott [name] returns today for followup. He is a very pleasant 79 year old man with complete heart block, status post permanent pacemaker insertion. He also has hypertension and dyslipidemia. In the interim, he has done well. He remains active, working in his yard. He denies chest pain or shortness of breath. He has reached elective replacement on his pacemaker. Allergies  Allergen Reactions  . Penicillins      Current Outpatient Prescriptions  Medication Sig Dispense Refill  . amitriptyline (ELAVIL) 50 MG tablet Take 50 mg by mouth at bedtime.       Marland Kitchen atenolol (TENORMIN) 50 MG tablet Take 50 mg by mouth daily.      Marland Kitchen azelastine (ASTELIN) 137 MCG/SPRAY nasal spray Place 1 spray into the nose 2 (two) times daily. Use in each nostril as directed      . cholecalciferol (VITAMIN D) 1000 UNITS tablet Take 2,000 Units by mouth daily.      . lansoprazole (PREVACID) 30 MG capsule Take 30 mg by mouth daily.       Marland Kitchen levothyroxine (SYNTHROID, LEVOTHROID) 100 MCG tablet Take 100 mcg by mouth daily.        . pravastatin (PRAVACHOL) 40 MG tablet Take 40 mg by mouth daily.        Marland Kitchen triamterene-hydrochlorothiazide (MAXZIDE-25) 37.5-25 MG per tablet Take 1 tablet by mouth daily.       No current facility-administered medications for this visit.     Past Medical History  Diagnosis Date  . Hypertension   . Thyroid disease     hypothyroidism  . Hyperlipidemia   . GERD (gastroesophageal reflux disease)   . Prostate cancer   . Cancer     throat  . Intestinal ulcer   . MI, old     ROS:   All systems reviewed and negative except as noted in the HPI.   Past Surgical History  Procedure Laterality Date  . Pacemaker insertion      dul chamber St Jude 06/14/03  . Cataract extraction       Family History  Problem Relation Age of Onset  . Heart disease Mother 9     History   Social History  . Marital Status: Divorced    Spouse Name: N/A    Number of Children: N/A  . Years of  Education: N/A   Occupational History  . Not on file.   Social History Main Topics  . Smoking status: Former Games developer  . Smokeless tobacco: Former Neurosurgeon  . Alcohol Use: No  . Drug Use: No  . Sexual Activity: Not on file   Other Topics Concern  . Not on file   Social History Narrative  . No narrative on file     BP 141/79  Pulse 69  Ht 6' (1.829 m)  Wt 187 lb 1.9 oz (84.877 kg)  BMI 25.37 kg/m2  Physical Exam:  Well appearing elderly man, NAD HEENT: Unremarkable Neck:  6 cm JVD, no thyromegally Lungs:  Clear with no wheezes, rales, or rhonchi. HEART:  Regular rate rhythm, no murmurs, no rubs, no clicks Abd:  soft, positive bowel sounds, no organomegally, no rebound, no guarding Ext:  2 plus pulses, no edema, no cyanosis, no clubbing Skin:  No rashes no nodules Neuro:  CN II through XII intact, motor grossly intact  DEVICE  Normal device function.  See PaceArt for details. Device at elective replacement  Assess/Plan:

## 2012-12-27 NOTE — Assessment & Plan Note (Signed)
His St. Jude dual-chamber pacemaker has reached elective replacement. We will schedule generator change out in the next several weeks. His leads appear to be working normally.

## 2012-12-27 NOTE — Patient Instructions (Signed)
Will need to have your battery changed out  Call me when you know what date.Marland KitchenMarland KitchenDates are10/30,11/7,11/10,11/12,11/17,11/20,11/24,11/28  [phone]  Anselm Pancoast

## 2013-01-03 ENCOUNTER — Encounter: Payer: Self-pay | Admitting: Internal Medicine

## 2013-01-11 ENCOUNTER — Other Ambulatory Visit: Payer: Self-pay | Admitting: *Deleted

## 2013-01-11 DIAGNOSIS — I459 Conduction disorder, unspecified: Secondary | ICD-10-CM

## 2013-01-11 DIAGNOSIS — Z45018 Encounter for adjustment and management of other part of cardiac pacemaker: Secondary | ICD-10-CM

## 2013-01-13 ENCOUNTER — Encounter (HOSPITAL_COMMUNITY): Payer: Self-pay | Admitting: Pharmacy Technician

## 2013-01-15 MED ORDER — VANCOMYCIN HCL IN DEXTROSE 1-5 GM/200ML-% IV SOLN
1000.0000 mg | INTRAVENOUS | Status: AC
  Filled 2013-01-15: qty 200

## 2013-01-15 MED ORDER — GENTAMICIN SULFATE 40 MG/ML IJ SOLN
80.0000 mg | INTRAMUSCULAR | Status: AC
  Filled 2013-01-15: qty 2

## 2013-01-16 ENCOUNTER — Ambulatory Visit (HOSPITAL_COMMUNITY)
Admission: RE | Admit: 2013-01-16 | Discharge: 2013-01-16 | Disposition: A | Discharge status: Home / Self Care | Payer: Medicare Other | Source: Ambulatory Visit | Attending: Internal Medicine | Admitting: Internal Medicine

## 2013-01-16 ENCOUNTER — Encounter (HOSPITAL_COMMUNITY)
Admission: RE | Disposition: A | Discharge status: Home / Self Care | Payer: Self-pay | Source: Ambulatory Visit | Attending: Internal Medicine

## 2013-01-16 ENCOUNTER — Encounter (HOSPITAL_COMMUNITY): Payer: Self-pay | Admitting: *Deleted

## 2013-01-16 DIAGNOSIS — E039 Hypothyroidism, unspecified: Secondary | ICD-10-CM | POA: Insufficient documentation

## 2013-01-16 DIAGNOSIS — Z8546 Personal history of malignant neoplasm of prostate: Secondary | ICD-10-CM | POA: Insufficient documentation

## 2013-01-16 DIAGNOSIS — I459 Conduction disorder, unspecified: Secondary | ICD-10-CM

## 2013-01-16 DIAGNOSIS — I442 Atrioventricular block, complete: Secondary | ICD-10-CM | POA: Insufficient documentation

## 2013-01-16 DIAGNOSIS — Z45018 Encounter for adjustment and management of other part of cardiac pacemaker: Secondary | ICD-10-CM | POA: Insufficient documentation

## 2013-01-16 DIAGNOSIS — K219 Gastro-esophageal reflux disease without esophagitis: Secondary | ICD-10-CM | POA: Insufficient documentation

## 2013-01-16 DIAGNOSIS — E785 Hyperlipidemia, unspecified: Secondary | ICD-10-CM | POA: Insufficient documentation

## 2013-01-16 DIAGNOSIS — Z85819 Personal history of malignant neoplasm of unspecified site of lip, oral cavity, and pharynx: Secondary | ICD-10-CM | POA: Insufficient documentation

## 2013-01-16 DIAGNOSIS — I10 Essential (primary) hypertension: Secondary | ICD-10-CM | POA: Insufficient documentation

## 2013-01-16 HISTORY — PX: PERMANENT PACEMAKER GENERATOR CHANGE: SHX6022

## 2013-01-16 LAB — CBC
HCT: 37 % — ABNORMAL LOW (ref 39.0–52.0)
MCHC: 32.4 g/dL (ref 30.0–36.0)
MCV: 88.7 fL (ref 78.0–100.0)
RDW: 14.8 % (ref 11.5–15.5)

## 2013-01-16 LAB — BASIC METABOLIC PANEL
BUN: 17 mg/dL (ref 6–23)
Calcium: 9.6 mg/dL (ref 8.4–10.5)
Chloride: 98 mEq/L (ref 96–112)
Creatinine, Ser: 1.35 mg/dL (ref 0.50–1.35)
GFR calc Af Amer: 54 mL/min — ABNORMAL LOW (ref >90)
GFR calc non Af Amer: 46 mL/min — ABNORMAL LOW (ref >90)

## 2013-01-16 SURGERY — PERMANENT PACEMAKER GENERATOR CHANGE
Anesthesia: LOCAL

## 2013-01-16 MED ORDER — MIDAZOLAM HCL 5 MG/5ML IJ SOLN
INTRAMUSCULAR | Status: AC
  Filled 2013-01-16: qty 5

## 2013-01-16 MED ORDER — MUPIROCIN 2 % EX OINT
TOPICAL_OINTMENT | CUTANEOUS | Status: AC
  Filled 2013-01-16: qty 22

## 2013-01-16 MED ORDER — SODIUM CHLORIDE 0.9 % IV SOLN: INTRAVENOUS | Status: DC

## 2013-01-16 MED ORDER — CHLORHEXIDINE GLUCONATE 4 % EX LIQD: 60.0000 mL | Freq: Once | CUTANEOUS | Status: DC

## 2013-01-16 MED ORDER — MUPIROCIN 2 % EX OINT: TOPICAL_OINTMENT | Freq: Two times a day (BID) | CUTANEOUS | Status: DC

## 2013-01-16 MED ORDER — ONDANSETRON HCL 4 MG/2ML IJ SOLN: 4.0000 mg | Freq: Four times a day (QID) | INTRAMUSCULAR | Status: DC | PRN

## 2013-01-16 MED ORDER — LIDOCAINE HCL (PF) 1 % IJ SOLN
INTRAMUSCULAR | Status: AC
  Filled 2013-01-16: qty 60

## 2013-01-16 MED ORDER — FENTANYL CITRATE 0.05 MG/ML IJ SOLN
INTRAMUSCULAR | Status: AC
  Filled 2013-01-16: qty 2

## 2013-01-16 MED ORDER — ACETAMINOPHEN 325 MG PO TABS: 325.0000 mg | ORAL_TABLET | ORAL | Status: DC | PRN

## 2013-01-16 NOTE — CV Procedure (Signed)
Electrophysiology procedure note  Procedure: Removal of a previous implanted dual-chamber pacemaker which agreed to elective replacement, and insertion of a new dual-chamber pacemaker  Indication: Complete heart block status post pacemaker insertion with the old device at elective replacement  Findings: After informed consent was obtained, the patient was taken to the diagnostic electrophysiology laboratory in the fasting state. After the usual preparation and draping, intravenous fentanyl and Versed were given for sedation. 30 cc of lidocaine was infiltrated into the left infraclavicular region. A 5 cm incision was carried out over this region and electrocautery was utilized to dissect down to the fascial plane. The pacemaker pocket was entered with the cautery. The leads were freed up other dense fibrous adhesions with electrocautery. The generator was removed with gentle traction. The atrial lead was evaluated, and had adequate pacing and sensing. The pacing impedance in the atrium was 400 ohms. The ventricular lead was removed from the old pacemaker and evaluated. There were no R waves secondary to complete heart block. The pacing impedance in the ventricle was 500 ohms and the pacing threshold was 1.1 V at 0.5 ms. The old ventricular pacing lead was connected to the new St. Jude dual-chamber pacemaker. The atrial lead was connected to the new dual-chamber pacemaker. The pocket was irrigated with antibiotic irrigation and the new device was placed back in the subcutaneous pocket. The incision was closed with 2-0 and 3-0 Vicryl suture. Benzoin and Steri-Strips for pain on the skin, a bandage was placed over the incision, and the patient was returned to his room in satisfactory condition.  Complications: There were no immediate complications  Conclusion: This demonstrate successful or movable of a previously implanted dual-chamber pacemaker which had reached elective replacement, and insertion of a new  dual-chamber St. Jude pacemaker without immediate procedure complication.  Lewayne Bunting, M.D.

## 2013-01-16 NOTE — H&P (View-Only) (Signed)
HPI Scott [name] returns today for followup. He is a very pleasant 79-year-old man with complete heart block, status post permanent pacemaker insertion. He also has hypertension and dyslipidemia. In the interim, he has done well. He remains active, working in his yard. He denies chest pain or shortness of breath. He has reached elective replacement on his pacemaker. Allergies  Allergen Reactions  . Penicillins      Current Outpatient Prescriptions  Medication Sig Dispense Refill  . amitriptyline (ELAVIL) 50 MG tablet Take 50 mg by mouth at bedtime.       . atenolol (TENORMIN) 50 MG tablet Take 50 mg by mouth daily.      . azelastine (ASTELIN) 137 MCG/SPRAY nasal spray Place 1 spray into the nose 2 (two) times daily. Use in each nostril as directed      . cholecalciferol (VITAMIN D) 1000 UNITS tablet Take 2,000 Units by mouth daily.      . lansoprazole (PREVACID) 30 MG capsule Take 30 mg by mouth daily.       . levothyroxine (SYNTHROID, LEVOTHROID) 100 MCG tablet Take 100 mcg by mouth daily.        . pravastatin (PRAVACHOL) 40 MG tablet Take 40 mg by mouth daily.        . triamterene-hydrochlorothiazide (MAXZIDE-25) 37.5-25 MG per tablet Take 1 tablet by mouth daily.       No current facility-administered medications for this visit.     Past Medical History  Diagnosis Date  . Hypertension   . Thyroid disease     hypothyroidism  . Hyperlipidemia   . GERD (gastroesophageal reflux disease)   . Prostate cancer   . Cancer     throat  . Intestinal ulcer   . MI, old     ROS:   All systems reviewed and negative except as noted in the HPI.   Past Surgical History  Procedure Laterality Date  . Pacemaker insertion      dul chamber St Jude 06/14/03  . Cataract extraction       Family History  Problem Relation Age of Onset  . Heart disease Mother 96     History   Social History  . Marital Status: Divorced    Spouse Name: N/A    Number of Children: N/A  . Years of  Education: N/A   Occupational History  . Not on file.   Social History Main Topics  . Smoking status: Former Smoker  . Smokeless tobacco: Former User  . Alcohol Use: No  . Drug Use: No  . Sexual Activity: Not on file   Other Topics Concern  . Not on file   Social History Narrative  . No narrative on file     BP 141/79  Pulse 69  Ht 6' (1.829 m)  Wt 187 lb 1.9 oz (84.877 kg)  BMI 25.37 kg/m2  Physical Exam:  Well appearing elderly man, NAD HEENT: Unremarkable Neck:  6 cm JVD, no thyromegally Lungs:  Clear with no wheezes, rales, or rhonchi. HEART:  Regular rate rhythm, no murmurs, no rubs, no clicks Abd:  soft, positive bowel sounds, no organomegally, no rebound, no guarding Ext:  2 plus pulses, no edema, no cyanosis, no clubbing Skin:  No rashes no nodules Neuro:  CN II through XII intact, motor grossly intact  DEVICE  Normal device function.  See PaceArt for details. Device at elective replacement  Assess/Plan:  

## 2013-01-16 NOTE — Interval H&P Note (Signed)
History and Physical Interval Note:  01/16/2013 10:55 AM  Scott [name]  has presented today for surgery, with the diagnosis of ERI  The various methods of treatment have been discussed with the patient and family. After consideration of risks, benefits and other options for treatment, the patient has consented to  Procedure(s): PERMANENT PACEMAKER GENERATOR CHANGE (N/A) as a surgical intervention .  The patient's history has been reviewed, patient examined, no change in status, stable for surgery.  I have reviewed the patient's chart and labs.  Questions were answered to the patient's satisfaction.     [name].D.

## 2013-01-30 ENCOUNTER — Ambulatory Visit (INDEPENDENT_AMBULATORY_CARE_PROVIDER_SITE_OTHER): Payer: Medicare Other | Admitting: *Deleted

## 2013-01-30 DIAGNOSIS — I495 Sick sinus syndrome: Secondary | ICD-10-CM

## 2013-01-30 LAB — MDC_IDC_ENUM_SESS_TYPE_INCLINIC
Battery Remaining Longevity: 98.4 mo
Battery Voltage: 3.1 V
Lead Channel Impedance Value: 400 Ohm
Lead Channel Impedance Value: 450 Ohm
Lead Channel Pacing Threshold Amplitude: 0.875 V
Lead Channel Sensing Intrinsic Amplitude: 3.1 mV
Lead Channel Setting Pacing Amplitude: 2.5 V
Lead Channel Setting Pacing Pulse Width: 0.5 ms
Lead Channel Setting Sensing Sensitivity: 5 mV

## 2013-01-30 NOTE — Progress Notes (Signed)
Wound check appointment. Steri-strips removed. Wound without redness or edema. Incision edges approximated, wound well healed. Normal device function. Thresholds, sensing, and impedances consistent with implant measurements. Device programmed at 3.5V/auto capture programmed on for extra safety margin until 3 month visit. Histogram distribution appropriate for patient and level of activity. No mode switches or high ventricular rates noted. Patient educated about wound care, arm mobility, lifting restrictions. ROV in 3 months with GT. 

## 2013-02-27 ENCOUNTER — Encounter: Payer: Self-pay | Admitting: Internal Medicine

## 2013-03-27 ENCOUNTER — Other Ambulatory Visit: Payer: Self-pay | Admitting: Internal Medicine

## 2013-03-27 DIAGNOSIS — I442 Atrioventricular block, complete: Secondary | ICD-10-CM

## 2013-03-27 DIAGNOSIS — Z950 Presence of cardiac pacemaker: Secondary | ICD-10-CM

## 2013-03-27 MED ORDER — TRIAMTERENE-HCTZ 37.5-25 MG PO TABS: 1.0000 | ORAL_TABLET | Freq: Every day | ORAL | Status: DC

## 2013-03-28 DIAGNOSIS — R7303 Prediabetes: Secondary | ICD-10-CM | POA: Insufficient documentation

## 2013-03-28 DIAGNOSIS — E559 Vitamin D deficiency, unspecified: Secondary | ICD-10-CM | POA: Insufficient documentation

## 2013-03-29 ENCOUNTER — Encounter: Payer: Self-pay | Admitting: Internal Medicine

## 2013-03-29 ENCOUNTER — Ambulatory Visit (INDEPENDENT_AMBULATORY_CARE_PROVIDER_SITE_OTHER): Payer: Medicare Other | Admitting: Internal Medicine

## 2013-03-29 VITALS — BP 116/76 | HR 76 | Temp 98.1°F | Resp 16 | Wt 198.4 lb

## 2013-03-29 DIAGNOSIS — E559 Vitamin D deficiency, unspecified: Secondary | ICD-10-CM

## 2013-03-29 DIAGNOSIS — Z79899 Other long term (current) drug therapy: Secondary | ICD-10-CM

## 2013-03-29 DIAGNOSIS — E039 Hypothyroidism, unspecified: Secondary | ICD-10-CM | POA: Insufficient documentation

## 2013-03-29 DIAGNOSIS — I10 Essential (primary) hypertension: Secondary | ICD-10-CM

## 2013-03-29 DIAGNOSIS — R7309 Other abnormal glucose: Secondary | ICD-10-CM | POA: Insufficient documentation

## 2013-03-29 DIAGNOSIS — E782 Mixed hyperlipidemia: Secondary | ICD-10-CM

## 2013-03-29 LAB — CBC WITH DIFFERENTIAL/PLATELET
Basophils Absolute: 0.1 10*3/uL (ref 0.0–0.1)
Basophils Relative: 1 % (ref 0–1)
Eosinophils Absolute: 0.8 10*3/uL — ABNORMAL HIGH (ref 0.0–0.7)
Eosinophils Relative: 7 % — ABNORMAL HIGH (ref 0–5)
HCT: 41.4 % (ref 39.0–52.0)
Hemoglobin: 13.5 g/dL (ref 13.0–17.0)
LYMPHS PCT: 21 % (ref 12–46)
Lymphs Abs: 2.7 10*3/uL (ref 0.7–4.0)
MCH: 27.6 pg (ref 26.0–34.0)
MCHC: 32.6 g/dL (ref 30.0–36.0)
MCV: 84.7 fL (ref 78.0–100.0)
Monocytes Absolute: 1.2 10*3/uL — ABNORMAL HIGH (ref 0.1–1.0)
Monocytes Relative: 9 % (ref 3–12)
NEUTROS PCT: 62 % (ref 43–77)
Neutro Abs: 8.1 10*3/uL — ABNORMAL HIGH (ref 1.7–7.7)
PLATELETS: 346 10*3/uL (ref 150–400)
RBC: 4.89 MIL/uL (ref 4.22–5.81)
RDW: 14.3 % (ref 11.5–15.5)
WBC: 12.9 10*3/uL — AB (ref 4.0–10.5)

## 2013-03-29 NOTE — Progress Notes (Signed)
Patient ID: Scott [name], male   DOB: 03-Jul-1925, 80 y.o.   MRN: [id number]   This very nice 80 y.o. DWM presents for 3 month follow up with Hypertension, Hyperlipidemia, Pre-Diabetes and Vitamin D Deficiency.    HTN predates since 2011. BP has been controlled at home. Today's BP: 116/76 mmHg. Patient denies any cardiac type chest pain, palpitations, dyspnea/orthopnea/PND, dizziness, claudication, or dependent edema.   Hyperlipidemia is controlled with diet & meds. Last Cholesterol was 175, Triglycerides were 83, HDL 57 and LDL 101 - at goal. Patient denies myalgias or other med SE's.    Also, the patient has history of PreDiabetes 6.3% in 2012 and with last A1c of 6.0% in 12/2012. Patient denies any symptoms of reactive hypoglycemia, diabetic polys, paresthesias or visual blurring.   Further, Patient has history of Vitamin D Deficiency of 79 in 2008 with last vitamin D of   63 in Nov 2014. Patient supplements vitamin D without any suspected side-effects.    Medication List       amitriptyline 50 MG tablet  Commonly known as:  ELAVIL  Take 50 mg by mouth at bedtime.     atenolol 50 MG tablet  Commonly known as:  TENORMIN  Take 50 mg by mouth daily.     azelastine 137 MCG/SPRAY nasal spray  Commonly known as:  ASTELIN  Place 2 sprays into the nose 2 (two) times daily. Use in each nostril as directed. Uses seasonally     cholecalciferol 1000 UNITS tablet  Commonly known as:  VITAMIN D  Take 2,000 Units by mouth daily.     levothyroxine 100 MCG tablet  Commonly known as:  SYNTHROID, LEVOTHROID  Take 100 mcg by mouth daily.     pravastatin 40 MG tablet  Commonly known as:  PRAVACHOL  Take 40 mg by mouth daily.     ranitidine 300 MG capsule  Commonly known as:  ZANTAC  Take 300 mg by mouth 2 (two) times daily.     triamterene-hydrochlorothiazide 37.5-25 MG per tablet  Commonly known as:  MAXZIDE-25  Take 1 tablet by mouth daily. For BP & fluid. Uses prn          Allergies  Allergen Reactions  . Penicillins     Childhood Reaction    PMHx:   Past Medical History  Diagnosis Date  . Hypertension   . Thyroid disease     hypothyroidism  . Hyperlipidemia   . GERD (gastroesophageal reflux disease)   . Prostate cancer   . Cancer     throat  . Intestinal ulcer   . MI, old   . Prediabetes   . Vitamin D deficiency     FHx:    Reviewed / unchanged  SHx:    Reviewed / unchanged  Systems Review: Constitutional: Denies fever, chills, wt changes, headaches, insomnia, fatigue, night sweats, change in appetite. Eyes: Denies redness, blurred vision, diplopia, discharge, itchy, watery eyes.  ENT: Denies discharge, congestion, post nasal drip, epistaxis, sore throat, earache, hearing loss, dental pain, tinnitus, vertigo, sinus pain, snoring.  CV: Denies chest pain, palpitations, irregular heartbeat, syncope, dyspnea, diaphoresis, orthopnea, PND, claudication, edema. Respiratory: denies cough, dyspnea, DOE, pleurisy, hoarseness, laryngitis, wheezing.  Gastrointestinal: Denies dysphagia, odynophagia, heartburn, reflux, water brash, abdominal pain or cramps, nausea, vomiting, bloating, diarrhea, constipation, hematemesis, melena, hematochezia,  or hemorrhoids. Genitourinary: Denies dysuria, frequency, urgency, nocturia, hesitancy, discharge, hematuria, flank pain. Musculoskeletal: Denies arthralgias, myalgias, stiffness, jt. swelling, pain, limp, strain/sprain.  Skin: Denies pruritus, rash, hives,  warts, acne, eczema, change in skin lesion(s). Neuro: No weakness, tremor, incoordination, spasms, paresthesia, or pain. Psychiatric: Denies confusion, memory loss, or sensory loss. Endo: Denies change in weight, skin, hair change.  Heme/Lymph: No excessive bleeding, bruising, orenlarged lymph nodes.  BP: 116/76  Pulse: 76  Temp: 98.1 F (36.7 C)  Resp: 16    Estimated body mass index is 27.68 kg/(m^2) as calculated from the following:   Height as  of 01/16/13: 5\' 11"  (1.803 m).   Weight as of this encounter: 198 lb 6.4 oz (89.994 kg).  On Exam: Appears well nourished - in no distress. Eyes: PERRLA, EOMs, conjunctiva no swelling or erythema. Sinuses: No frontal/maxillary tenderness ENT/Mouth: EAC's clear, TM's nl w/o erythema, bulging. Nares clear w/o erythema, swelling, exudates. Oropharynx clear without erythema or exudates. Oral hygiene is good. Tongue normal, non obstructing. Hearing intact.  Neck: Supple. Thyroid nl. Car 2+/2+ without bruits, nodes or JVD. Chest: Respirations nl with BS clear & equal w/o rales, rhonchi, wheezing or stridor.  Cor: Heart sounds normal w/ regular rate and rhythm without sig. murmurs, gallops, clicks, or rubs. Peripheral pulses normal and equal  without edema.  Abdomen: Soft & bowel sounds normal. Non-tender w/o guarding, rebound, hernias, masses, or organomegaly.  Lymphatics: Unremarkable.  Musculoskeletal: Full ROM all peripheral extremities, joint stability, 5/5 strength, and normal gait.  Skin: Warm, dry without exposed rashes, lesions, ecchymosis apparent.  Neuro: Cranial nerves intact, reflexes equal bilaterally. Sensory-motor testing grossly intact. Tendon reflexes grossly intact.  Pysch: Alert & oriented x 3. Insight and judgement nl & appropriate. No ideations.  Assessment and Plan:  1. Hypertension - Continue monitor blood pressure at home. Continue diet/meds same.  2. Hyperlipidemia - Continue diet/meds, exercise,& lifestyle modifications. Continue monitor periodic cholesterol/liver & renal functions   3. Pre-diabetes - Continue diet, exercise, lifestyle modifications. Monitor appropriate labs.  4. Vitamin D Deficiency - Continue supplementation.  5. Hypothyroidism  6. Hx/o Prostate [phone]) and Larynx Ca (1989)  Recommended regular exercise, BP monitoring, weight control, and discussed med and SE's. Recommended labs to assess and monitor clinical status. Further disposition pending  results of labs.

## 2013-03-29 NOTE — Patient Instructions (Signed)

## 2013-03-30 LAB — HEPATIC FUNCTION PANEL
ALK PHOS: 58 U/L (ref 39–117)
ALT: 14 U/L (ref 0–53)
AST: 20 U/L (ref 0–37)
Albumin: 4.1 g/dL (ref 3.5–5.2)
BILIRUBIN DIRECT: 0.1 mg/dL (ref 0.0–0.3)
BILIRUBIN TOTAL: 0.7 mg/dL (ref 0.3–1.2)
Indirect Bilirubin: 0.6 mg/dL (ref 0.0–0.9)
Total Protein: 7.3 g/dL (ref 6.0–8.3)

## 2013-03-30 LAB — BASIC METABOLIC PANEL WITH GFR
BUN: 22 mg/dL (ref 6–23)
CHLORIDE: 100 meq/L (ref 96–112)
CO2: 30 mEq/L (ref 19–32)
Calcium: 9.6 mg/dL (ref 8.4–10.5)
Creat: 1.25 mg/dL (ref 0.50–1.35)
GFR, EST NON AFRICAN AMERICAN: 52 mL/min — AB
GFR, Est African American: 60 mL/min
Glucose, Bld: 82 mg/dL (ref 70–99)
Potassium: 4.3 mEq/L (ref 3.5–5.3)
SODIUM: 139 meq/L (ref 135–145)

## 2013-03-30 LAB — HEMOGLOBIN A1C
Hgb A1c MFr Bld: 5.8 % — ABNORMAL HIGH (ref <5.7)
Mean Plasma Glucose: 120 mg/dL — ABNORMAL HIGH (ref <117)

## 2013-03-30 LAB — TSH: TSH: 1.318 u[IU]/mL (ref 0.350–4.500)

## 2013-03-30 LAB — LIPID PANEL
CHOL/HDL RATIO: 2.9 ratio
Cholesterol: 181 mg/dL (ref 0–200)
HDL: 63 mg/dL (ref >39)
LDL CALC: 106 mg/dL — AB (ref 0–99)
TRIGLYCERIDES: 60 mg/dL (ref <150)
VLDL: 12 mg/dL (ref 0–40)

## 2013-03-30 LAB — INSULIN, FASTING: Insulin fasting, serum: 14 u[IU]/mL (ref 3–28)

## 2013-03-30 LAB — VITAMIN D 25 HYDROXY (VIT D DEFICIENCY, FRACTURES): Vit D, 25-Hydroxy: 67 ng/mL (ref 30–89)

## 2013-03-30 LAB — MAGNESIUM: Magnesium: 1.8 mg/dL (ref 1.5–2.5)

## 2013-04-27 ENCOUNTER — Ambulatory Visit (INDEPENDENT_AMBULATORY_CARE_PROVIDER_SITE_OTHER): Payer: Medicare Other | Admitting: Internal Medicine

## 2013-04-27 ENCOUNTER — Encounter: Payer: Self-pay | Admitting: Internal Medicine

## 2013-04-27 VITALS — BP 119/86 | HR 70 | Ht 72.0 in | Wt 186.0 lb

## 2013-04-27 DIAGNOSIS — I10 Essential (primary) hypertension: Secondary | ICD-10-CM

## 2013-04-27 DIAGNOSIS — I442 Atrioventricular block, complete: Secondary | ICD-10-CM

## 2013-04-27 DIAGNOSIS — Z950 Presence of cardiac pacemaker: Secondary | ICD-10-CM

## 2013-04-27 LAB — MDC_IDC_ENUM_SESS_TYPE_INCLINIC
Battery Remaining Longevity: 96 mo
Battery Voltage: 3.01 V
Brady Statistic RA Percent Paced: 93 %
Brady Statistic RV Percent Paced: 99.83 %
Implantable Pulse Generator Serial Number: 7556456
Lead Channel Impedance Value: 412.5 Ohm
Lead Channel Impedance Value: 487.5 Ohm
Lead Channel Pacing Threshold Amplitude: 0.75 V
Lead Channel Pacing Threshold Amplitude: 1.25 V
Lead Channel Pacing Threshold Pulse Width: 0.5 ms
Lead Channel Sensing Intrinsic Amplitude: 4 mV
Lead Channel Setting Pacing Amplitude: 1.5 V
Lead Channel Setting Pacing Pulse Width: 0.5 ms
MDC IDC MSMT LEADCHNL RA PACING THRESHOLD PULSEWIDTH: 0.5 ms
MDC IDC MSMT LEADCHNL RV SENSING INTR AMPL: 10 mV
MDC IDC SESS DTM: 20150219110052
MDC IDC SET LEADCHNL RA PACING AMPLITUDE: 2 V
MDC IDC SET LEADCHNL RV SENSING SENSITIVITY: 5 mV

## 2013-04-27 NOTE — Patient Instructions (Signed)
Your physician wants you to follow-up in: Nov 2015 with Dr Knox Saliva will receive a reminder letter in the mail two months in advance. If you don't receive a letter, please call our office to schedule the follow-up appointment.    Remote monitoring is used to monitor your Pacemaker of ICD from home. This monitoring reduces the number of office visits required to check your device to one time per year. It allows [place] to keep an eye on the functioning of your device to ensure it is working properly. You are scheduled for a device check from home on 08/01/13. You may send your transmission at any time that day. If you have a wireless device, the transmission will be sent automatically. After your physician reviews your transmission, you will receive a postcard with your next transmission date.

## 2013-04-27 NOTE — Assessment & Plan Note (Signed)
His blood pressure is well controlled. He will continue his current meds and will maintain a low sodium diet.

## 2013-04-27 NOTE — Assessment & Plan Note (Signed)
His St. Jude DDD PM is working normally. Will recheck in several months. 

## 2013-04-27 NOTE — Progress Notes (Signed)
HPI Mr. Lovingood returns today for followup. He is a very pleasant elderly man with complete heart block, status post permanent pacemaker insertion. He underwent generator change out 3 months ago. He also has hypertension and dyslipidemia. In the interim, he has done well. He remains active, working in his yard. He denies chest pain or shortness of breath. Allergies  Allergen Reactions  . Penicillins     Childhood Reaction     Current Outpatient Prescriptions  Medication Sig Dispense Refill  . amitriptyline (ELAVIL) 50 MG tablet Take 50 mg by mouth at bedtime.       Marland Kitchen atenolol (TENORMIN) 50 MG tablet Take 50 mg by mouth daily.      Marland Kitchen azelastine (ASTELIN) 137 MCG/SPRAY nasal spray Place 2 sprays into the nose 2 (two) times daily. Use in each nostril as directed. Uses seasonally      . cholecalciferol (VITAMIN D) 1000 UNITS tablet Take 2,000 Units by mouth daily.      Marland Kitchen levothyroxine (SYNTHROID, LEVOTHROID) 100 MCG tablet Take 100 mcg by mouth daily.       . pravastatin (PRAVACHOL) 40 MG tablet Take 40 mg by mouth daily.       . ranitidine (ZANTAC) 300 MG capsule Take 300 mg by mouth 2 (two) times daily.      Marland Kitchen triamterene-hydrochlorothiazide (MAXZIDE-25) 37.5-25 MG per tablet Take 1 tablet by mouth daily. For BP & fluid. Uses prn       No current facility-administered medications for this visit.     Past Medical History  Diagnosis Date  . Hypertension   . Thyroid disease     hypothyroidism  . Hyperlipidemia   . GERD (gastroesophageal reflux disease)   . Prostate cancer   . Cancer     throat  . Intestinal ulcer   . MI, old   . Prediabetes   . Vitamin D deficiency     ROS:   All systems reviewed and negative except as noted in the HPI.   Past Surgical History  Procedure Laterality Date  . Pacemaker insertion      dul chamber St Jude 06/14/03  . Cataract extraction       Family History  Problem Relation Age of Onset  . Heart disease Mother 68     History   Social  History  . Marital Status: Divorced    Spouse Name: N/A    Number of Children: N/A  . Years of Education: N/A   Occupational History  . Not on file.   Social History Main Topics  . Smoking status: Former Research scientist (life sciences)  . Smokeless tobacco: Former Systems developer  . Alcohol Use: No  . Drug Use: No  . Sexual Activity: Not on file   Other Topics Concern  . Not on file   Social History Narrative  . No narrative on file     BP 119/86  Pulse 70  Ht 6' (1.829 m)  Wt 186 lb (84.369 kg)  BMI 25.22 kg/m2  Physical Exam:  Well appearing elderly man, NAD HEENT: Unremarkable Neck:  No JVD, no thyromegally Lungs:  Clear with no wheezes, rales, or rhonchi. HEART:  Regular rate rhythm, no murmurs, no rubs, no clicks Abd:  soft, positive bowel sounds, no organomegally, no rebound, no guarding Ext:  2 plus pulses, no edema, no cyanosis, no clubbing Skin:  No rashes no nodules Neuro:  CN II through XII intact, motor grossly intact  DEVICE  Normal device function.  See PaceArt for details.  Assess/Plan:

## 2013-05-11 ENCOUNTER — Other Ambulatory Visit: Payer: Self-pay | Admitting: *Deleted

## 2013-05-11 ENCOUNTER — Other Ambulatory Visit: Payer: Self-pay | Admitting: Internal Medicine

## 2013-05-11 MED ORDER — PRAVASTATIN SODIUM 40 MG PO TABS: 40.0000 mg | ORAL_TABLET | Freq: Every day | ORAL | Status: AC

## 2013-06-27 ENCOUNTER — Encounter: Payer: Self-pay | Admitting: Emergency Medicine

## 2013-06-27 ENCOUNTER — Ambulatory Visit (INDEPENDENT_AMBULATORY_CARE_PROVIDER_SITE_OTHER): Payer: Medicare Other | Admitting: Emergency Medicine

## 2013-06-27 ENCOUNTER — Ambulatory Visit: Payer: Self-pay | Admitting: Physician Assistant

## 2013-06-27 VITALS — BP 122/64 | HR 72 | Temp 98.0°F | Resp 16 | Ht 71.5 in | Wt 193.0 lb

## 2013-06-27 DIAGNOSIS — J309 Allergic rhinitis, unspecified: Secondary | ICD-10-CM

## 2013-06-27 DIAGNOSIS — E782 Mixed hyperlipidemia: Secondary | ICD-10-CM

## 2013-06-27 DIAGNOSIS — R7309 Other abnormal glucose: Secondary | ICD-10-CM

## 2013-06-27 DIAGNOSIS — I10 Essential (primary) hypertension: Secondary | ICD-10-CM

## 2013-06-27 DIAGNOSIS — E039 Hypothyroidism, unspecified: Secondary | ICD-10-CM

## 2013-06-27 LAB — CBC WITH DIFFERENTIAL/PLATELET
BASOS PCT: 0 % (ref 0–1)
Basophils Absolute: 0 10*3/uL (ref 0.0–0.1)
EOS ABS: 0.6 10*3/uL (ref 0.0–0.7)
EOS PCT: 4 % (ref 0–5)
HCT: 36 % — ABNORMAL LOW (ref 39.0–52.0)
Hemoglobin: 12.3 g/dL — ABNORMAL LOW (ref 13.0–17.0)
Lymphocytes Relative: 15 % (ref 12–46)
Lymphs Abs: 2.3 10*3/uL (ref 0.7–4.0)
MCH: 28 pg (ref 26.0–34.0)
MCHC: 34.2 g/dL (ref 30.0–36.0)
MCV: 82 fL (ref 78.0–100.0)
Monocytes Absolute: 1.2 10*3/uL — ABNORMAL HIGH (ref 0.1–1.0)
Monocytes Relative: 8 % (ref 3–12)
NEUTROS PCT: 73 % (ref 43–77)
Neutro Abs: 11.1 10*3/uL — ABNORMAL HIGH (ref 1.7–7.7)
PLATELETS: 431 10*3/uL — AB (ref 150–400)
RBC: 4.39 MIL/uL (ref 4.22–5.81)
RDW: 15.4 % (ref 11.5–15.5)
WBC: 15.2 10*3/uL — ABNORMAL HIGH (ref 4.0–10.5)

## 2013-06-27 MED ORDER — AZELASTINE HCL 0.1 % NA SOLN: 2.0000 | Freq: Two times a day (BID) | NASAL | Status: AC

## 2013-06-27 NOTE — Progress Notes (Signed)
Subjective:    Patient ID: Scott [name], male    DOB: 1925/08/27, 80 y.o.   MRN: [id number]  HPI Comments: 80 yo WM presents for 3 month F/U for HTN, Cholesterol, Pre-Dm, D. Deficient. He is keeping active and trying to walk QD. He is eating healthy. He notes BP good at home. He has had some increase in allergy symptoms with clear nasal drainage. He has been using NS QD with improvement. Last labs  CHOL         181   03/29/2013 HDL           63   03/29/2013 LDLCALC      106   03/29/2013 TRIG          60   03/29/2013 CHOLHDL      2.9   03/29/2013 HGBA1C      5.8   03/29/2013 WBC     12.9   03/29/2013 HGB     13.5   03/29/2013 HCT     41.4   03/29/2013 MCV     84.7   03/29/2013 PLT      346   03/29/2013   He has a history of urine urgency with occasional incontinence and notes that has been present since hospitalization. He notes mild improvement with symptoms with modification of bathroom habits. He denies fever/ pain/ hematuria.  Hypertension      Medication List       This list is accurate as of: 06/27/13  3:28 PM.  Always use your most recent med list.               amitriptyline 50 MG tablet  Commonly known as:  ELAVIL  Take 50 mg by mouth at bedtime.     atenolol 50 MG tablet  Commonly known as:  TENORMIN  Take 50 mg by mouth daily.     azelastine 137 MCG/SPRAY nasal spray  Commonly known as:  ASTELIN  Place 2 sprays into the nose 2 (two) times daily. Use in each nostril as directed. Uses seasonally     cholecalciferol 1000 UNITS tablet  Commonly known as:  VITAMIN D  Take 2,000 Units by mouth daily.     CLARITIN PO  Take by mouth daily.     levothyroxine 100 MCG tablet  Commonly known as:  SYNTHROID, LEVOTHROID  Take 100 mcg by mouth daily.     pravastatin 40 MG tablet  Commonly known as:  PRAVACHOL  Take 1 tablet (40 mg total) by mouth daily.     ranitidine 300 MG capsule  Commonly known as:  ZANTAC  Take 300 mg by mouth 2 (two) times daily.     triamterene-hydrochlorothiazide 37.5-25 MG per tablet  Commonly known as:  MAXZIDE-25  Take 1 tablet by mouth daily. For BP & fluid. Uses prn       Allergies  Allergen Reactions  . Penicillins     Childhood Reaction   Past Medical History  Diagnosis Date  . Hypertension   . Thyroid disease     hypothyroidism  . Hyperlipidemia   . GERD (gastroesophageal reflux disease)   . Prostate cancer   . Cancer     throat  . Intestinal ulcer   . MI, old   . Prediabetes   . Vitamin D deficiency      Review of Systems  HENT: Positive for postnasal drip.   Genitourinary: Positive for urgency.  All other systems reviewed and are negative.  BP 122/64  Pulse 72  Temp(Src) 98 F (36.7 C) (Temporal)  Resp 16  Ht 5' 11.5" (1.816 m)  Wt 193 lb (87.544 kg)  BMI 26.55 kg/m2     Objective:   Physical Exam  Nursing note and vitals reviewed. Constitutional: He is oriented to person, place, and time. He appears well-developed and well-nourished.  HENT:  Head: Normocephalic and atraumatic.  Right Ear: External ear normal.  Left Ear: External ear normal.  Nose: Nose normal.  Mouth/Throat: No oropharyngeal exudate.  Cloudy TM's bilaterally   Eyes: Conjunctivae and EOM are normal.  Neck: Normal range of motion. Neck supple. No JVD present. No thyromegaly present.  Cardiovascular: Normal rate, regular rhythm, normal heart sounds and intact distal pulses.   Pulmonary/Chest: Effort normal and breath sounds normal.  Abdominal: Soft. Bowel sounds are normal. He exhibits no distension and no mass. There is no tenderness. There is no rebound and no guarding.  Musculoskeletal: Normal range of motion. He exhibits no edema and no tenderness.  Lymphadenopathy:    He has no cervical adenopathy.  Neurological: He is alert and oriented to person, place, and time. He has normal reflexes. No cranial nerve deficit. Coordination normal.  Skin: Skin is warm and dry.  Psychiatric: He has a normal mood and  affect. His behavior is normal. Judgment and thought content normal.          Assessment & Plan:  1.  3 month F/U for HTN, Cholesterol, Pre-Dm, D. Deficient. Needs healthy diet, cardio QD and obtain healthy weight. Check Labs, Check BP if >130/80 call office   2.  Hypothyroid- Recheck labs with recent dose change  3. Chronic urine Urgency since REhab/ hospialization- w/c with any symptom increase

## 2013-06-27 NOTE — Patient Instructions (Signed)
   Bad carbs also include fruit juice, alcohol, and sweet tea. These are empty calories that do not signal to your brain that you are full.   Please remember the good carbs are still carbs which convert into sugar. So please measure them out no more than 1/2-1 cup of rice, oatmeal, pasta, and beans.  Veggies are however free foods! Pile them on.   I like lean protein at every meal such as chicken, turkey, pork chops, cottage cheese, etc. Just do not fry these meats and please center your meal around vegetable, the meats should be a side dish.   No all fruit is created equal. Please see the list below, the fruit at the bottom is higher in sugars than the fruit at the top     Allergic Rhinitis Allergic rhinitis is when the mucous membranes in the nose respond to allergens. Allergens are particles in the air that cause your body to have an allergic reaction. This causes you to release allergic antibodies. Through a chain of events, these eventually cause you to release histamine into the blood stream. Although meant to protect the body, it is this release of histamine that causes your discomfort, such as frequent sneezing, congestion, and an itchy, runny nose.  CAUSES  Seasonal allergic rhinitis (hay fever) is caused by pollen allergens that may come from grasses, trees, and weeds. Year-round allergic rhinitis (perennial allergic rhinitis) is caused by allergens such as house dust mites, pet dander, and mold spores.  SYMPTOMS   Nasal stuffiness (congestion).  Itchy, runny nose with sneezing and tearing of the eyes. DIAGNOSIS  Your health care provider can help you determine the allergen or allergens that trigger your symptoms. If you and your health care provider are unable to determine the allergen, skin or blood testing may be used. TREATMENT  Allergic Rhinitis does not have a cure, but it can be controlled by:  Medicines and allergy shots (immunotherapy).  Avoiding the allergen. Hay  fever may often be treated with antihistamines in pill or nasal spray forms. Antihistamines block the effects of histamine. There are over-the-counter medicines that may help with nasal congestion and swelling around the eyes. Check with your health care provider before taking or giving this medicine.  If avoiding the allergen or the medicine prescribed do not work, there are many new medicines your health care provider can prescribe. Stronger medicine may be used if initial measures are ineffective. Desensitizing injections can be used if medicine and avoidance does not work. Desensitization is when a patient is given ongoing shots until the body becomes less sensitive to the allergen. Make sure you follow up with your health care provider if problems continue. HOME CARE INSTRUCTIONS It is not possible to completely avoid allergens, but you can reduce your symptoms by taking steps to limit your exposure to them. It helps to know exactly what you are allergic to so that you can avoid your specific triggers. SEEK MEDICAL CARE IF:   You have a fever.  You develop a cough that does not stop easily (persistent).  You have shortness of breath.  You start wheezing.  Symptoms interfere with normal daily activities. Document Released: 11/18/2000 Document Revised: 12/14/2012 Document Reviewed: 10/31/2012 [employer] Patient Information 2014 [employer].  

## 2013-06-28 ENCOUNTER — Other Ambulatory Visit: Payer: Self-pay | Admitting: Emergency Medicine

## 2013-06-28 LAB — HEPATIC FUNCTION PANEL
ALT: 24 U/L (ref 0–53)
AST: 25 U/L (ref 0–37)
Albumin: 3.8 g/dL (ref 3.5–5.2)
Alkaline Phosphatase: 69 U/L (ref 39–117)
BILIRUBIN DIRECT: 0.1 mg/dL (ref 0.0–0.3)
BILIRUBIN INDIRECT: 0.3 mg/dL (ref 0.2–1.2)
Total Bilirubin: 0.4 mg/dL (ref 0.2–1.2)
Total Protein: 7.2 g/dL (ref 6.0–8.3)

## 2013-06-28 LAB — BASIC METABOLIC PANEL WITH GFR
BUN: 24 mg/dL — AB (ref 6–23)
CHLORIDE: 99 meq/L (ref 96–112)
CO2: 27 mEq/L (ref 19–32)
CREATININE: 1.48 mg/dL — AB (ref 0.50–1.35)
Calcium: 8.9 mg/dL (ref 8.4–10.5)
GFR, EST AFRICAN AMERICAN: 49 mL/min — AB
GFR, Est Non African American: 43 mL/min — ABNORMAL LOW
Glucose, Bld: 84 mg/dL (ref 70–99)
Potassium: 4.6 mEq/L (ref 3.5–5.3)
Sodium: 138 mEq/L (ref 135–145)

## 2013-06-28 LAB — LIPID PANEL
CHOL/HDL RATIO: 3.5 ratio
CHOLESTEROL: 156 mg/dL (ref 0–200)
HDL: 44 mg/dL (ref >39)
LDL CALC: 100 mg/dL — AB (ref 0–99)
Triglycerides: 58 mg/dL (ref <150)
VLDL: 12 mg/dL (ref 0–40)

## 2013-06-28 LAB — TSH: TSH: 1.5 u[IU]/mL (ref 0.350–4.500)

## 2013-06-28 LAB — HEMOGLOBIN A1C
HEMOGLOBIN A1C: 6 % — AB (ref <5.7)
Mean Plasma Glucose: 126 mg/dL — ABNORMAL HIGH (ref <117)

## 2013-06-28 MED ORDER — LEVOFLOXACIN 250 MG PO TABS: 250.0000 mg | ORAL_TABLET | Freq: Every day | ORAL | Status: AC

## 2013-07-24 ENCOUNTER — Encounter (INDEPENDENT_AMBULATORY_CARE_PROVIDER_SITE_OTHER): Payer: Medicare Other | Admitting: Ophthalmology

## 2013-07-24 DIAGNOSIS — H3530 Unspecified macular degeneration: Secondary | ICD-10-CM

## 2013-07-24 DIAGNOSIS — H35039 Hypertensive retinopathy, unspecified eye: Secondary | ICD-10-CM

## 2013-07-24 DIAGNOSIS — H43819 Vitreous degeneration, unspecified eye: Secondary | ICD-10-CM

## 2013-07-24 DIAGNOSIS — I10 Essential (primary) hypertension: Secondary | ICD-10-CM

## 2013-08-01 ENCOUNTER — Telehealth: Payer: Self-pay | Admitting: Cardiology

## 2013-08-01 ENCOUNTER — Encounter: Payer: Medicare Other | Admitting: *Deleted

## 2013-08-01 ENCOUNTER — Other Ambulatory Visit: Payer: Self-pay | Admitting: *Deleted

## 2013-08-01 MED ORDER — LEVOTHYROXINE SODIUM 100 MCG PO TABS: 100.0000 ug | ORAL_TABLET | Freq: Every day | ORAL | Status: AC

## 2013-08-01 NOTE — Telephone Encounter (Signed)
LMOVM reminding pt to send remote transmission.   

## 2013-08-02 ENCOUNTER — Ambulatory Visit (INDEPENDENT_AMBULATORY_CARE_PROVIDER_SITE_OTHER): Payer: Medicare Other | Admitting: *Deleted

## 2013-08-02 DIAGNOSIS — R6889 Other general symptoms and signs: Secondary | ICD-10-CM

## 2013-08-02 DIAGNOSIS — N390 Urinary tract infection, site not specified: Secondary | ICD-10-CM

## 2013-08-02 LAB — CBC WITH DIFFERENTIAL/PLATELET
Basophils Absolute: 0 10*3/uL (ref 0.0–0.1)
Basophils Relative: 0 % (ref 0–1)
Eosinophils Absolute: 0.7 10*3/uL (ref 0.0–0.7)
Eosinophils Relative: 5 % (ref 0–5)
HEMATOCRIT: 38.6 % — AB (ref 39.0–52.0)
Hemoglobin: 12.8 g/dL — ABNORMAL LOW (ref 13.0–17.0)
LYMPHS ABS: 2.4 10*3/uL (ref 0.7–4.0)
LYMPHS PCT: 17 % (ref 12–46)
MCH: 27.6 pg (ref 26.0–34.0)
MCHC: 33.2 g/dL (ref 30.0–36.0)
MCV: 83.2 fL (ref 78.0–100.0)
MONO ABS: 1.1 10*3/uL — AB (ref 0.1–1.0)
Monocytes Relative: 8 % (ref 3–12)
Neutro Abs: 9.7 10*3/uL — ABNORMAL HIGH (ref 1.7–7.7)
Neutrophils Relative %: 70 % (ref 43–77)
Platelets: 347 10*3/uL (ref 150–400)
RBC: 4.64 MIL/uL (ref 4.22–5.81)
RDW: 15 % (ref 11.5–15.5)
WBC: 13.9 10*3/uL — AB (ref 4.0–10.5)

## 2013-08-03 LAB — URINALYSIS, ROUTINE W REFLEX MICROSCOPIC
BILIRUBIN URINE: NEGATIVE
GLUCOSE, UA: NEGATIVE mg/dL
Hgb urine dipstick: NEGATIVE
KETONES UR: NEGATIVE mg/dL
Leukocytes, UA: NEGATIVE
Nitrite: NEGATIVE
Protein, ur: NEGATIVE mg/dL
SPECIFIC GRAVITY, URINE: 1.015 (ref 1.005–1.030)
Urobilinogen, UA: 0.2 mg/dL (ref 0.0–1.0)
pH: 7 (ref 5.0–8.0)

## 2013-08-03 LAB — URINE CULTURE
Colony Count: NO GROWTH
Organism ID, Bacteria: NO GROWTH

## 2013-08-04 ENCOUNTER — Encounter: Payer: Self-pay | Admitting: Cardiology

## 2013-08-14 ENCOUNTER — Ambulatory Visit (INDEPENDENT_AMBULATORY_CARE_PROVIDER_SITE_OTHER): Payer: Medicare Other | Admitting: Ophthalmology

## 2013-08-14 DIAGNOSIS — I10 Essential (primary) hypertension: Secondary | ICD-10-CM

## 2013-08-14 DIAGNOSIS — H35039 Hypertensive retinopathy, unspecified eye: Secondary | ICD-10-CM

## 2013-08-14 DIAGNOSIS — H3530 Unspecified macular degeneration: Secondary | ICD-10-CM

## 2013-08-14 DIAGNOSIS — H43819 Vitreous degeneration, unspecified eye: Secondary | ICD-10-CM

## 2013-09-05 ENCOUNTER — Ambulatory Visit (INDEPENDENT_AMBULATORY_CARE_PROVIDER_SITE_OTHER): Payer: Medicare Other

## 2013-09-05 DIAGNOSIS — R6889 Other general symptoms and signs: Secondary | ICD-10-CM

## 2013-09-05 DIAGNOSIS — R300 Dysuria: Secondary | ICD-10-CM

## 2013-09-05 LAB — CBC WITH DIFFERENTIAL/PLATELET
BASOS ABS: 0 10*3/uL (ref 0.0–0.1)
BASOS PCT: 0 % (ref 0–1)
EOS PCT: 4 % (ref 0–5)
Eosinophils Absolute: 0.5 10*3/uL (ref 0.0–0.7)
HCT: 36.2 % — ABNORMAL LOW (ref 39.0–52.0)
Hemoglobin: 12.3 g/dL — ABNORMAL LOW (ref 13.0–17.0)
LYMPHS PCT: 17 % (ref 12–46)
Lymphs Abs: 2.3 10*3/uL (ref 0.7–4.0)
MCH: 27.7 pg (ref 26.0–34.0)
MCHC: 34 g/dL (ref 30.0–36.0)
MCV: 81.5 fL (ref 78.0–100.0)
Monocytes Absolute: 1.3 10*3/uL — ABNORMAL HIGH (ref 0.1–1.0)
Monocytes Relative: 10 % (ref 3–12)
Neutro Abs: 9.2 10*3/uL — ABNORMAL HIGH (ref 1.7–7.7)
Neutrophils Relative %: 69 % (ref 43–77)
Platelets: 353 10*3/uL (ref 150–400)
RBC: 4.44 MIL/uL (ref 4.22–5.81)
RDW: 14.6 % (ref 11.5–15.5)
WBC: 13.4 10*3/uL — AB (ref 4.0–10.5)

## 2013-09-05 NOTE — Progress Notes (Signed)
Patient ID: Scott [name], male   DOB: 03/28/1925, 80 y.o.   MRN: [id number] Patient here today to recheck abnormal labs and urine.

## 2013-09-06 LAB — URINE CULTURE: Colony Count: 9000

## 2013-09-06 LAB — URINALYSIS, ROUTINE W REFLEX MICROSCOPIC
Bilirubin Urine: NEGATIVE
Glucose, UA: NEGATIVE mg/dL
Hgb urine dipstick: NEGATIVE
Ketones, ur: NEGATIVE mg/dL
Leukocytes, UA: NEGATIVE
Nitrite: NEGATIVE
Protein, ur: NEGATIVE mg/dL
Specific Gravity, Urine: 1.015 (ref 1.005–1.030)
UROBILINOGEN UA: 0.2 mg/dL (ref 0.0–1.0)
pH: 7.5 (ref 5.0–8.0)

## 2013-09-07 ENCOUNTER — Other Ambulatory Visit: Payer: Self-pay | Admitting: Emergency Medicine

## 2013-09-07 DIAGNOSIS — R6889 Other general symptoms and signs: Secondary | ICD-10-CM

## 2013-09-21 ENCOUNTER — Other Ambulatory Visit: Payer: Self-pay

## 2013-09-21 DIAGNOSIS — Z950 Presence of cardiac pacemaker: Secondary | ICD-10-CM

## 2013-09-21 DIAGNOSIS — I442 Atrioventricular block, complete: Secondary | ICD-10-CM

## 2013-09-21 MED ORDER — ATENOLOL 50 MG PO TABS: 50.0000 mg | ORAL_TABLET | Freq: Every day | ORAL | Status: AC

## 2013-09-27 ENCOUNTER — Encounter: Payer: Self-pay | Admitting: Internal Medicine

## 2013-10-18 ENCOUNTER — Encounter: Payer: Self-pay | Admitting: Internal Medicine

## 2013-10-18 ENCOUNTER — Ambulatory Visit (INDEPENDENT_AMBULATORY_CARE_PROVIDER_SITE_OTHER): Payer: Medicare Other | Admitting: Internal Medicine

## 2013-10-18 VITALS — BP 116/60 | HR 84 | Temp 98.0°F | Resp 18 | Ht 71.0 in | Wt 190.0 lb

## 2013-10-18 DIAGNOSIS — Z1331 Encounter for screening for depression: Secondary | ICD-10-CM

## 2013-10-18 DIAGNOSIS — E782 Mixed hyperlipidemia: Secondary | ICD-10-CM

## 2013-10-18 DIAGNOSIS — Z0000 Encounter for general adult medical examination without abnormal findings: Secondary | ICD-10-CM

## 2013-10-18 DIAGNOSIS — R7309 Other abnormal glucose: Secondary | ICD-10-CM

## 2013-10-18 DIAGNOSIS — Z79899 Other long term (current) drug therapy: Secondary | ICD-10-CM

## 2013-10-18 DIAGNOSIS — Z789 Other specified health status: Secondary | ICD-10-CM

## 2013-10-18 DIAGNOSIS — I10 Essential (primary) hypertension: Secondary | ICD-10-CM

## 2013-10-18 DIAGNOSIS — E559 Vitamin D deficiency, unspecified: Secondary | ICD-10-CM

## 2013-10-18 DIAGNOSIS — C61 Malignant neoplasm of prostate: Secondary | ICD-10-CM

## 2013-10-18 DIAGNOSIS — Z1212 Encounter for screening for malignant neoplasm of rectum: Secondary | ICD-10-CM

## 2013-10-18 DIAGNOSIS — R7303 Prediabetes: Secondary | ICD-10-CM

## 2013-10-18 LAB — CBC WITH DIFFERENTIAL/PLATELET
BASOS PCT: 1 % (ref 0–1)
Basophils Absolute: 0.1 10*3/uL (ref 0.0–0.1)
EOS ABS: 0.6 10*3/uL (ref 0.0–0.7)
Eosinophils Relative: 5 % (ref 0–5)
HCT: 36.5 % — ABNORMAL LOW (ref 39.0–52.0)
Hemoglobin: 12.3 g/dL — ABNORMAL LOW (ref 13.0–17.0)
LYMPHS ABS: 2.5 10*3/uL (ref 0.7–4.0)
Lymphocytes Relative: 22 % (ref 12–46)
MCH: 27.2 pg (ref 26.0–34.0)
MCHC: 33.7 g/dL (ref 30.0–36.0)
MCV: 80.8 fL (ref 78.0–100.0)
Monocytes Absolute: 1 10*3/uL (ref 0.1–1.0)
Monocytes Relative: 9 % (ref 3–12)
NEUTROS PCT: 63 % (ref 43–77)
Neutro Abs: 7.1 10*3/uL (ref 1.7–7.7)
Platelets: 389 10*3/uL (ref 150–400)
RBC: 4.52 MIL/uL (ref 4.22–5.81)
RDW: 14.9 % (ref 11.5–15.5)
WBC: 11.3 10*3/uL — ABNORMAL HIGH (ref 4.0–10.5)

## 2013-10-18 NOTE — Patient Instructions (Signed)

## 2013-10-18 NOTE — Progress Notes (Signed)
Patient ID: Scott [name], male   DOB: [birth year], 80 y.o.   MRN: [id number]   Annual Screening Comprehensive Examination  This very nice 80 y.o.male presents for complete physical.  Patient has been followed for HTN, Diabetes  Prediabetes, Hyperlipidemia, and Vitamin D Deficiency.   HTN predates since 1995. Patient's BP has been controlled at home.Today's BP: 116/60 mmHg. In 2005 he was found in 3rd degree Heart block and Permanent Paemaker was inserted by Dr Cristopher Peru. Patient denies any cardiac symptoms as chest pain, palpitations, shortness of breath, dizziness or ankle swelling.   Patient's hyperlipidemia is controlled with diet and medications. Patient denies myalgias or other medication SE's. Last lipids were 06/27/2013: Cholesterol 156; HDL  44; LDL  100; Triglycerides 58   Patient has prediabetes since 2013 with A1c 6.0% and he has managed this with diet and patient denies reactive hypoglycemic symptoms, visual blurring, diabetic polys or paresthesias. Last A1c was 06/27/2013: Hemoglobin-A1c 6.0*   Finally, patient has history of Vitamin D Deficiency of 33 in 2008 and last vitamin D was  67 on 03/29/2013.  Medication Sig  . amitriptyline (ELAVIL) 50 MG tablet Take 50 mg  at bedtime.   Marland Kitchen atenolol  50 MG tablet Take 1 tablet  daily.  . ASTELIN nasal spray Place 2 sprays  2 x daily.  Marland Kitchen VITAMIN D 1000 UNITS tablet Take 2,000 Units  daily.  Marland Kitchen levothyroxine 100 MCG tablet Take 1 tablet daily.  . Loratadine  Take b daily.  . pravastatin  40 MG tablet Take 1 tablet daily.  . ranitidine  300 MG capsule Take  1 tablet 2 x daily.  Marland Kitchen triamterene-hctz 37.5-25  Take 1 tablet  daily.Uses prn   Allergies  Allergen Reactions  . Penicillins     Childhood Reaction   Past Medical History  Diagnosis Date  . Hypertension   . Thyroid disease     hypothyroidism  . Hyperlipidemia   . GERD (gastroesophageal reflux disease)   . Prostate cancer   . Cancer     throat  . Intestinal ulcer   .  MI, old   . Prediabetes   . Vitamin D deficiency    Past Surgical History  Procedure Laterality Date  . Pacemaker insertion      dul chamber St Jude 06/14/03  . Cataract extraction     Family History  Problem Relation Age of Onset  . Heart disease Mother 36   History   Social History  . Marital Status: Divorced    Spouse Name: N/A    Number of Children: N/A  . Years of Education: N/A   Occupational History  . retired   Social History Main Topics  . Smoking status: Former Smoker    Quit date: 03/09/1982  . Smokeless tobacco: Former Systems developer  . Alcohol Use: No  . Drug Use: No  . Sexual Activity: Not on file    ROS Constitutional: Denies fever, chills, weight loss/gain, headaches, insomnia, fatigue, night sweats or change in appetite. Eyes: Denies redness, blurred vision, diplopia, discharge, itchy or watery eyes.  ENT: Denies discharge, congestion, post nasal drip, epistaxis, sore throat, earache, hearing loss, dental pain, Tinnitus, Vertigo, Sinus pain or snoring.  Cardio: Denies chest pain, palpitations, irregular heartbeat, syncope, dyspnea, diaphoresis, orthopnea, PND, claudication or edema Respiratory: denies cough, dyspnea, DOE, pleurisy, hoarseness, laryngitis or wheezing.  Gastrointestinal: Denies dysphagia, heartburn, reflux, water brash, pain, cramps, nausea, vomiting, bloating, diarrhea, constipation, hematemesis, melena, hematochezia, jaundice or hemorrhoids Genitourinary: Denies dysuria,  frequency, urgency, nocturia, hesitancy, discharge, hematuria or flank pain Musculoskeletal: Denies arthralgia, myalgia, stiffness, Jt. Swelling, pain, limp or strain/sprain. Denies Falls. Skin: Denies puritis, rash, hives, warts, acne, eczema or change in skin lesion Neuro: No weakness, tremor, incoordination, spasms, paresthesia or pain Psychiatric: Denies confusion, memory loss or sensory loss. Denies Depression. Endocrine: Denies change in weight, skin, hair change, nocturia, and  paresthesia, diabetic polys, visual blurring or hyper / hypo glycemic episodes.  Heme/Lymph: No excessive bleeding, bruising or enlarged lymph nodes.  Physical Exam  BP 116/60  Pulse 84  Temp(Src) 98 F (36.7 C) (Temporal)  Resp 18  Ht 5\' 11"  (1.803 m)  Wt 190 lb (86.183 kg)  BMI 26.51 kg/m2  General Appearance: Well nourished, in no apparent distress. Eyes: PERRLA, EOMs, conjunctiva no swelling or erythema, normal fundi and vessels. Sinuses: No frontal/maxillary tenderness ENT/Mouth: EACs patent / TMs  nl. Nares clear without erythema, swelling, mucoid exudates. Oral hygiene is good. No erythema, swelling, or exudate. Tongue normal, non-obstructing. Tonsils not swollen or erythematous. Hearing normal.  Neck: Supple, thyroid normal. No bruits, nodes or JVD. Respiratory: Respiratory effort normal.  BS equal and clear bilateral without rales, rhonci, wheezing or stridor. Cardio: Heart sounds are normal with regular rate and rhythm and no murmurs, rubs or gallops. Peripheral pulses are normal and equal bilaterally without edema. No aortic or femoral bruits. Chest: symmetric with normal excursions and percussion.  Abdomen: Flat, soft, with bowl sounds. Nontender, no guarding, rebound, hernias, masses, or organomegaly.  Lymphatics: Non tender without lymphadenopathy.  Genitourinary: No hernias.Testes nl. DRE -deferred for age. Musculoskeletal: Full ROM all peripheral extremities, joint stability, 5/5 strength, and normal gait. Skin: Warm and dry without rashes, lesions, cyanosis, clubbing or  ecchymosis.  Neuro: Cranial nerves intact, reflexes equal bilaterally. Normal muscle tone, no cerebellar symptoms. Sensation intact.  Pysch: Awake and oriented X 3with normal affect, insight and judgment appropriate.  Assessment and Plan  1. Annual Screening Examination 2. Hypertension  3. Hyperlipidemia 4. Pre Diabetes 5. Vitamin D Deficiency 6. COPD 7. Prostate Cancer,Hx 8. Laryngeal  Cancer,Hx 9. ASHD w/Pacemaker  Continue prudent diet as discussed, weight control, BP monitoring, regular exercise, and medications as discussed.  Discussed med effects and SE's. Routine screening labs and tests as requested with regular follow-up as recommended.

## 2013-10-19 LAB — LIPID PANEL
CHOL/HDL RATIO: 3.2 ratio
Cholesterol: 153 mg/dL (ref 0–200)
HDL: 48 mg/dL (ref >39)
LDL Cholesterol: 93 mg/dL (ref 0–99)
Triglycerides: 61 mg/dL (ref <150)
VLDL: 12 mg/dL (ref 0–40)

## 2013-10-19 LAB — VITAMIN D 25 HYDROXY (VIT D DEFICIENCY, FRACTURES): VIT D 25 HYDROXY: 73 ng/mL (ref 30–89)

## 2013-10-19 LAB — MICROALBUMIN / CREATININE URINE RATIO
CREATININE, URINE: 89.5 mg/dL
MICROALB/CREAT RATIO: 11.8 mg/g (ref 0.0–30.0)
Microalb, Ur: 1.06 mg/dL (ref 0.00–1.89)

## 2013-10-19 LAB — BASIC METABOLIC PANEL WITH GFR
BUN: 28 mg/dL — ABNORMAL HIGH (ref 6–23)
CALCIUM: 9.4 mg/dL (ref 8.4–10.5)
CO2: 29 mEq/L (ref 19–32)
Chloride: 97 mEq/L (ref 96–112)
Creat: 1.53 mg/dL — ABNORMAL HIGH (ref 0.50–1.35)
GFR, EST AFRICAN AMERICAN: 47 mL/min — AB
GFR, Est Non African American: 41 mL/min — ABNORMAL LOW
Glucose, Bld: 75 mg/dL (ref 70–99)
Potassium: 4.3 mEq/L (ref 3.5–5.3)
Sodium: 137 mEq/L (ref 135–145)

## 2013-10-19 LAB — HEMOGLOBIN A1C
Hgb A1c MFr Bld: 6.2 % — ABNORMAL HIGH (ref <5.7)
Mean Plasma Glucose: 131 mg/dL — ABNORMAL HIGH (ref <117)

## 2013-10-19 LAB — HEPATIC FUNCTION PANEL
ALT: 12 U/L (ref 0–53)
AST: 17 U/L (ref 0–37)
Albumin: 3.8 g/dL (ref 3.5–5.2)
Alkaline Phosphatase: 78 U/L (ref 39–117)
BILIRUBIN TOTAL: 0.6 mg/dL (ref 0.2–1.2)
Bilirubin, Direct: 0.1 mg/dL (ref 0.0–0.3)
Indirect Bilirubin: 0.5 mg/dL (ref 0.2–1.2)
Total Protein: 7.1 g/dL (ref 6.0–8.3)

## 2013-10-19 LAB — URINALYSIS, MICROSCOPIC ONLY
Bacteria, UA: NONE SEEN
Casts: NONE SEEN
Crystals: NONE SEEN

## 2013-10-19 LAB — TSH: TSH: 1.963 u[IU]/mL (ref 0.350–4.500)

## 2013-10-19 LAB — INSULIN, FASTING: INSULIN FASTING, SERUM: 18 u[IU]/mL (ref 3–28)

## 2013-10-19 LAB — MAGNESIUM: Magnesium: 2.1 mg/dL (ref 1.5–2.5)

## 2013-10-19 LAB — PSA: PSA: 0.46 ng/mL (ref <=4.00)

## 2013-10-23 ENCOUNTER — Encounter (INDEPENDENT_AMBULATORY_CARE_PROVIDER_SITE_OTHER): Payer: Medicare Other | Admitting: Ophthalmology

## 2013-10-27 ENCOUNTER — Other Ambulatory Visit: Payer: Self-pay | Admitting: *Deleted

## 2013-10-27 DIAGNOSIS — Z1212 Encounter for screening for malignant neoplasm of rectum: Secondary | ICD-10-CM

## 2013-10-27 LAB — POC HEMOCCULT BLD/STL (HOME/3-CARD/SCREEN)
Card #3 Fecal Occult Blood, POC: NEGATIVE
FECAL OCCULT BLD: NEGATIVE
Fecal Occult Blood, POC: NEGATIVE

## 2013-11-19 ENCOUNTER — Emergency Department (HOSPITAL_COMMUNITY)
Admission: EM | Admit: 2013-11-19 | Discharge: 2013-12-07 | Disposition: E | Payer: Medicare Other | Attending: Emergency Medicine | Admitting: Emergency Medicine

## 2013-11-19 ENCOUNTER — Encounter: Payer: Self-pay | Admitting: Internal Medicine

## 2013-11-19 DIAGNOSIS — E559 Vitamin D deficiency, unspecified: Secondary | ICD-10-CM | POA: Diagnosis not present

## 2013-11-19 DIAGNOSIS — K219 Gastro-esophageal reflux disease without esophagitis: Secondary | ICD-10-CM | POA: Diagnosis not present

## 2013-11-19 DIAGNOSIS — Z8546 Personal history of malignant neoplasm of prostate: Secondary | ICD-10-CM | POA: Diagnosis not present

## 2013-11-19 DIAGNOSIS — I10 Essential (primary) hypertension: Secondary | ICD-10-CM | POA: Insufficient documentation

## 2013-11-19 DIAGNOSIS — Z87891 Personal history of nicotine dependence: Secondary | ICD-10-CM | POA: Diagnosis not present

## 2013-11-19 DIAGNOSIS — I469 Cardiac arrest, cause unspecified: Secondary | ICD-10-CM | POA: Diagnosis not present

## 2013-11-19 DIAGNOSIS — Z85819 Personal history of malignant neoplasm of unspecified site of lip, oral cavity, and pharynx: Secondary | ICD-10-CM | POA: Insufficient documentation

## 2013-11-19 DIAGNOSIS — E785 Hyperlipidemia, unspecified: Secondary | ICD-10-CM | POA: Diagnosis not present

## 2013-11-19 DIAGNOSIS — I252 Old myocardial infarction: Secondary | ICD-10-CM | POA: Insufficient documentation

## 2013-11-19 DIAGNOSIS — E039 Hypothyroidism, unspecified: Secondary | ICD-10-CM | POA: Diagnosis not present

## 2013-11-19 DIAGNOSIS — Z880 Allergy status to penicillin: Secondary | ICD-10-CM | POA: Diagnosis not present

## 2013-11-19 MED ORDER — LIDOCAINE HCL (CARDIAC) 20 MG/ML IV SOLN
INTRAVENOUS | Status: DC
Start: 2013-11-19 — End: 2013-11-19
  Filled 2013-11-19: qty 5

## 2013-11-19 MED ORDER — ETOMIDATE 2 MG/ML IV SOLN
INTRAVENOUS | Status: AC
  Filled 2013-11-19: qty 20

## 2013-11-19 MED ORDER — EPINEPHRINE HCL 0.1 MG/ML IJ SOSY
PREFILLED_SYRINGE | INTRAMUSCULAR | Status: DC | PRN
  Administered 2013-11-19 (×3): 0.1 mg via INTRAVENOUS

## 2013-11-19 MED ORDER — SODIUM BICARBONATE 8.4 % IV SOLN
INTRAVENOUS | Status: DC | PRN
  Administered 2013-11-19: 50 meq via INTRAVENOUS

## 2013-11-19 MED ORDER — ROCURONIUM BROMIDE 50 MG/5ML IV SOLN
INTRAVENOUS | Status: AC
  Filled 2013-11-19: qty 2

## 2013-11-19 MED ORDER — SUCCINYLCHOLINE CHLORIDE 20 MG/ML IJ SOLN
INTRAMUSCULAR | Status: AC
  Filled 2013-11-19: qty 1

## 2013-11-20 MED FILL — Medication: Qty: 1 | Status: AC

## 2013-12-07 DIAGNOSIS — 419620001 Death: Secondary | SNOMED CT

## 2013-12-07 NOTE — ED Provider Notes (Signed)
CSN: [id number]     Arrival date & time 12-12-2013  1458 History   First MD Initiated Contact with Patient 12-12-2013 1512     Chief Complaint  Patient presents with  . Cardiac Arrest   level V caveat history is obtained by EMS. Cardiac arrest  Seen on arrival (Consider location/radiation/quality/duration/timing/severity/associated sxs/prior Treatment) HPI EMS called to patient's home was complaining of shortness of breath. EMS noted that patient's pulse oximetry on room air upon arrival was 77%, consistent with profound hypoxemia EMS treated patient with supplemental oxygen and administer intravenous fluid bolus prior to coming here. and nearly prior to coming here patient had decerebrate posturing upon arrival here patient was pulseless with agonal respirations and unresponsive Past Medical History  Diagnosis Date  . Hypertension   . Thyroid disease     hypothyroidism  . Hyperlipidemia   . GERD (gastroesophageal reflux disease)   . Prostate cancer   . Cancer     throat  . Intestinal ulcer   . MI, old   . Prediabetes   . Vitamin D deficiency    Past Surgical History  Procedure Laterality Date  . Pacemaker insertion      dul chamber St Jude 06/14/03  . Cataract extraction     Family History  Problem Relation Age of Onset  . Heart disease Mother 56   History  Substance Use Topics  . Smoking status: Former Smoker    Quit date: 03/09/1982  . Smokeless tobacco: Former Systems developer  . Alcohol Use: No    Review of Systems  Unable to perform ROS: Other      Allergies  Penicillins  Home Medications   Prior to Admission medications   Medication Sig Start Date End Date Taking? Authorizing Provider  amitriptyline (ELAVIL) 50 MG tablet Take 50 mg by mouth at bedtime.     Historical Provider, MD  atenolol (TENORMIN) 50 MG tablet Take 1 tablet (50 mg total) by mouth daily. 09/21/13   Unk Pinto, MD  azelastine (ASTELIN) 137 MCG/SPRAY nasal spray Place 2 sprays into both nostrils 2  (two) times daily. Use in each nostril as directed. Uses seasonally 06/27/13   Ardis Hughs, PA-C  cholecalciferol (VITAMIN D) 1000 UNITS tablet Take 2,000 Units by mouth daily.    Historical Provider, MD  lansoprazole (PREVACID) 30 MG capsule Take 30 mg by mouth daily at 12 noon.    Historical Provider, MD  levothyroxine (SYNTHROID, LEVOTHROID) 100 MCG tablet Take 1 tablet (100 mcg total) by mouth daily. 08/01/13   Unk Pinto, MD  Loratadine (CLARITIN PO) Take by mouth daily.    Historical Provider, MD  pravastatin (PRAVACHOL) 40 MG tablet Take 1 tablet (40 mg total) by mouth daily. 05/11/13   Unk Pinto, MD  ranitidine (ZANTAC) 300 MG tablet  09/16/13   Historical Provider, MD  triamterene-hydrochlorothiazide (MAXZIDE-25) 37.5-25 MG per tablet Take 1 tablet by mouth daily. For BP & fluid. Uses prn 03/27/13   Unk Pinto, MD   There were no vitals taken for this visit. Physical Exam  Nursing note and vitals reviewed. Constitutional:  Pulse 0 respirations approximately 4 per minute, agonal , unresponsive Glasgow Coma Score 3  HENT:  Head: Normocephalic and atraumatic.  Conjunctivae  pale  Eyes:  No extraocular movement pupils fixed  Neck: No tracheal deviation present.  Cardiovascular:  Heart sounds absent peripheral pulses absent  Pulmonary/Chest:  Agonal respirations  Abdominal: He exhibits no distension.  Musculoskeletal:  No signs of trauma. Peripheral pulses absent  Neurological:  Glasgow Coma Score 3    ED Course  Procedures (including critical care time) Labs Review Labs Reviewed - No data to display  Imaging Review No results found.   EKG Interpretation None     Cardiopulmonary Resuscitation (CPR) Procedure Note Directed/Performed by: Orlie Dakin I personally directed ancillary staff and/or performed CPR in an effort to regain return of spontaneous circulation and to maintain cardiac, neuro and systemic perfusion.  INTUBATION Performed by:  Orlie Dakin  Required items: required blood products, implants, devices, and special equipment available Patient identity confirmed: provided demographic data and hospital-assigned identification number Time out: Immediately prior to procedure a "time out" was called to verify the correct patient, procedure, equipment, support staff and site/side marked as required.  Indications: cardiac arrest  Intubation method: direct Laryngoscopy   Preoxygenation: BVM  Sedatives:none  Tube Size: 7.5 cuffed  Post-procedure assessment: chest rise and ETCO2 monitor Breath sounds: equal and absent over the epigastrium Tube secured with: ETT holder .   Patient tolerated the procedure well with no immediate complications.   CPR started on upon arrival Patient's rhythm PEA. Administered epinephrine 1 mg intravenously 1450 9 PM Rhythm remained PEA. Administered epinephrine 1 mg intravenously at 15 02 pm Rhythm remained PEA. Administered sodium bicarbonate 1 amp iv at 1504 pm Rhythm deteriotated to venticular tachycardia Defibrillated at 1507 p.m. 200 J. Rhythm converted to a paced rhythm with no pulses i.e. PEA Patient was pronounced dead at 1508 p.m. with no cardiac activity seen on ultrasound MDM  Diagnosis dead on arrival Family notified Spoke with [place] who will sign the death certificate Final diagnoses:  None      Orlie Dakin, MD 2013-12-09 [phone]

## 2013-12-07 NOTE — Progress Notes (Signed)
Patient arrived at ED at 1445 patient became unresponsive CPR and BVM was initiated by MD, subsequently patient was intubated by DR. Jacubowitz with a 7.5 ETT taped at 25 cm at lip good BBS ausculted, good color change on endtidal CO2 detector, patient was being BVM thru ETT.

## 2013-12-07 NOTE — Progress Notes (Signed)
PT died on way to hospital with daughter following behind ambulance. Met daughter around 3:30pm and offered support while we waited for her brother (pt's son) to arrive. Stayed close while family viewed the body. Offered emotional support.   Vanetta Mulders December 14, 2013

## 2013-12-07 NOTE — Code Documentation (Signed)
Patient time of death occurred at 79

## 2013-12-07 NOTE — ED Notes (Signed)
Pts daughter at bedside  

## 2013-12-07 NOTE — ED Notes (Signed)
Pt arrived to ED with agonal respirations and then went into cardiac arrest. Pt family called EMS because they were unable to get in touch with pt. When EMS arrived pt was tachypnea, pale, cold diaphoretic. NRB mask placed on pt by Fire. EMS started IV and pt started seizing and then started to talk again. While EMS pulling into EMS bay pt started screaming and then started seizing again.

## 2013-12-07 NOTE — Code Documentation (Addendum)
Pt having agonal respirations, pulse check, no puse, CPR started.

## 2013-12-07 DEATH — deceased

## 2014-01-23 ENCOUNTER — Encounter: Payer: Medicare Other | Admitting: Internal Medicine

## 2014-02-09 ENCOUNTER — Ambulatory Visit: Payer: Self-pay | Admitting: Physician Assistant

## 2014-02-10 IMAGING — CR DG CHEST 2V
3 series · 3 of 3 positions shown · non-contrast
Comparison: Two-view chest x-ray 08/04/2007, [DATE],
05/26/2004, 05/22/2003.

CLINICAL DATA: Cough.  Fever.  Shortness of breath.  Generalized
body aches.  Diarrhea.  Indwelling pacemaker.

CHEST - 2 VIEW

[x chest ap]
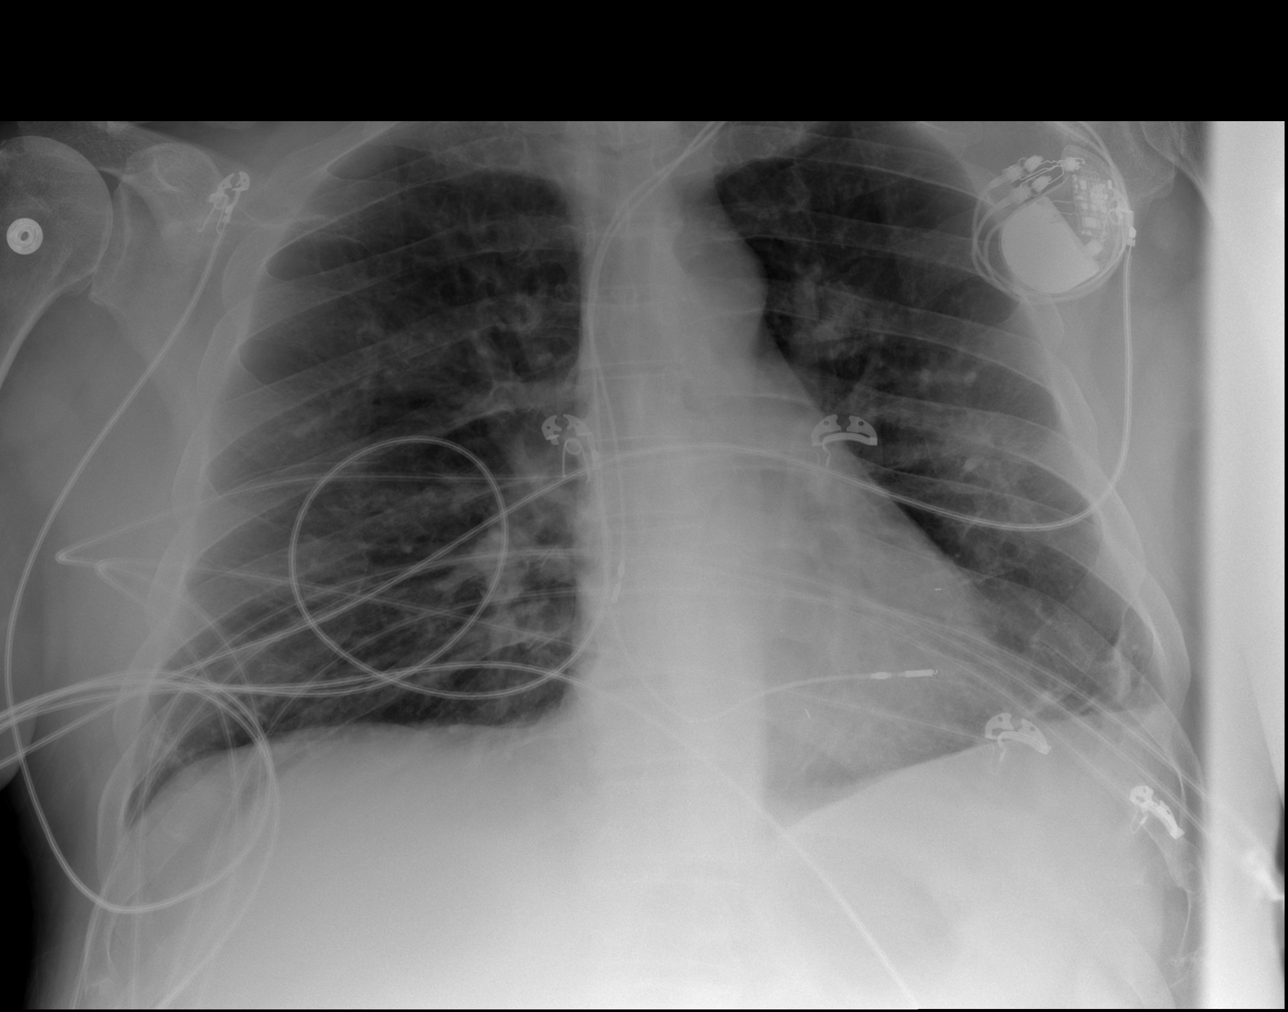

[w chest lat (1 of 2)]
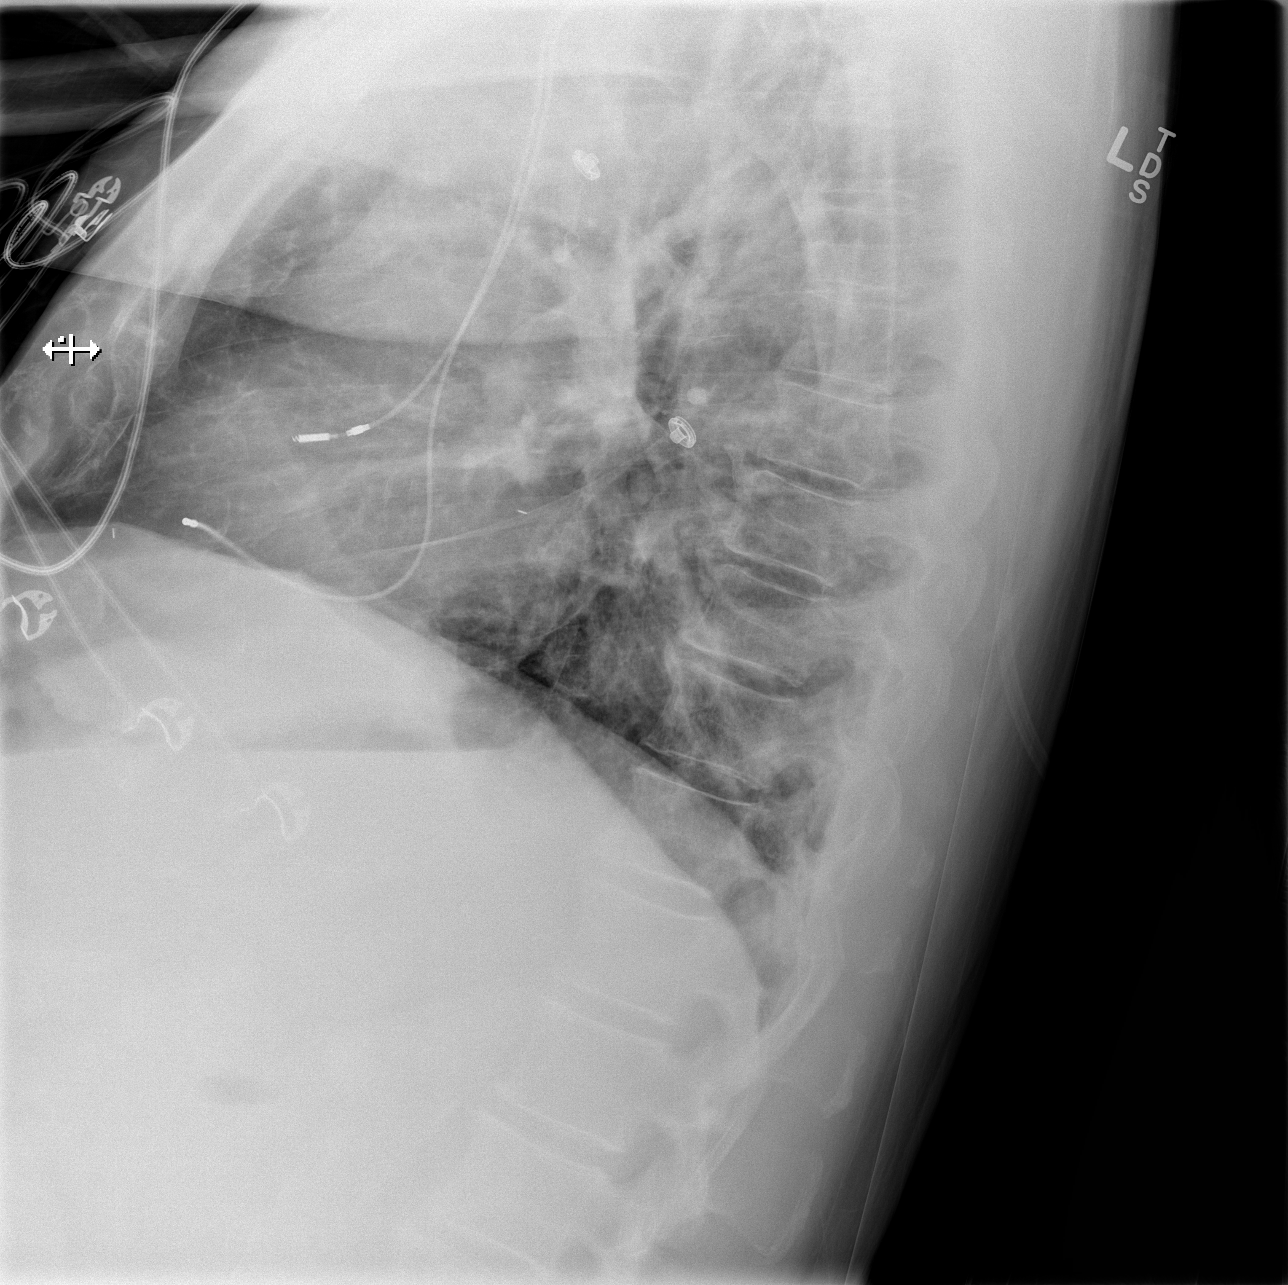

[w chest lat (2 of 2)]
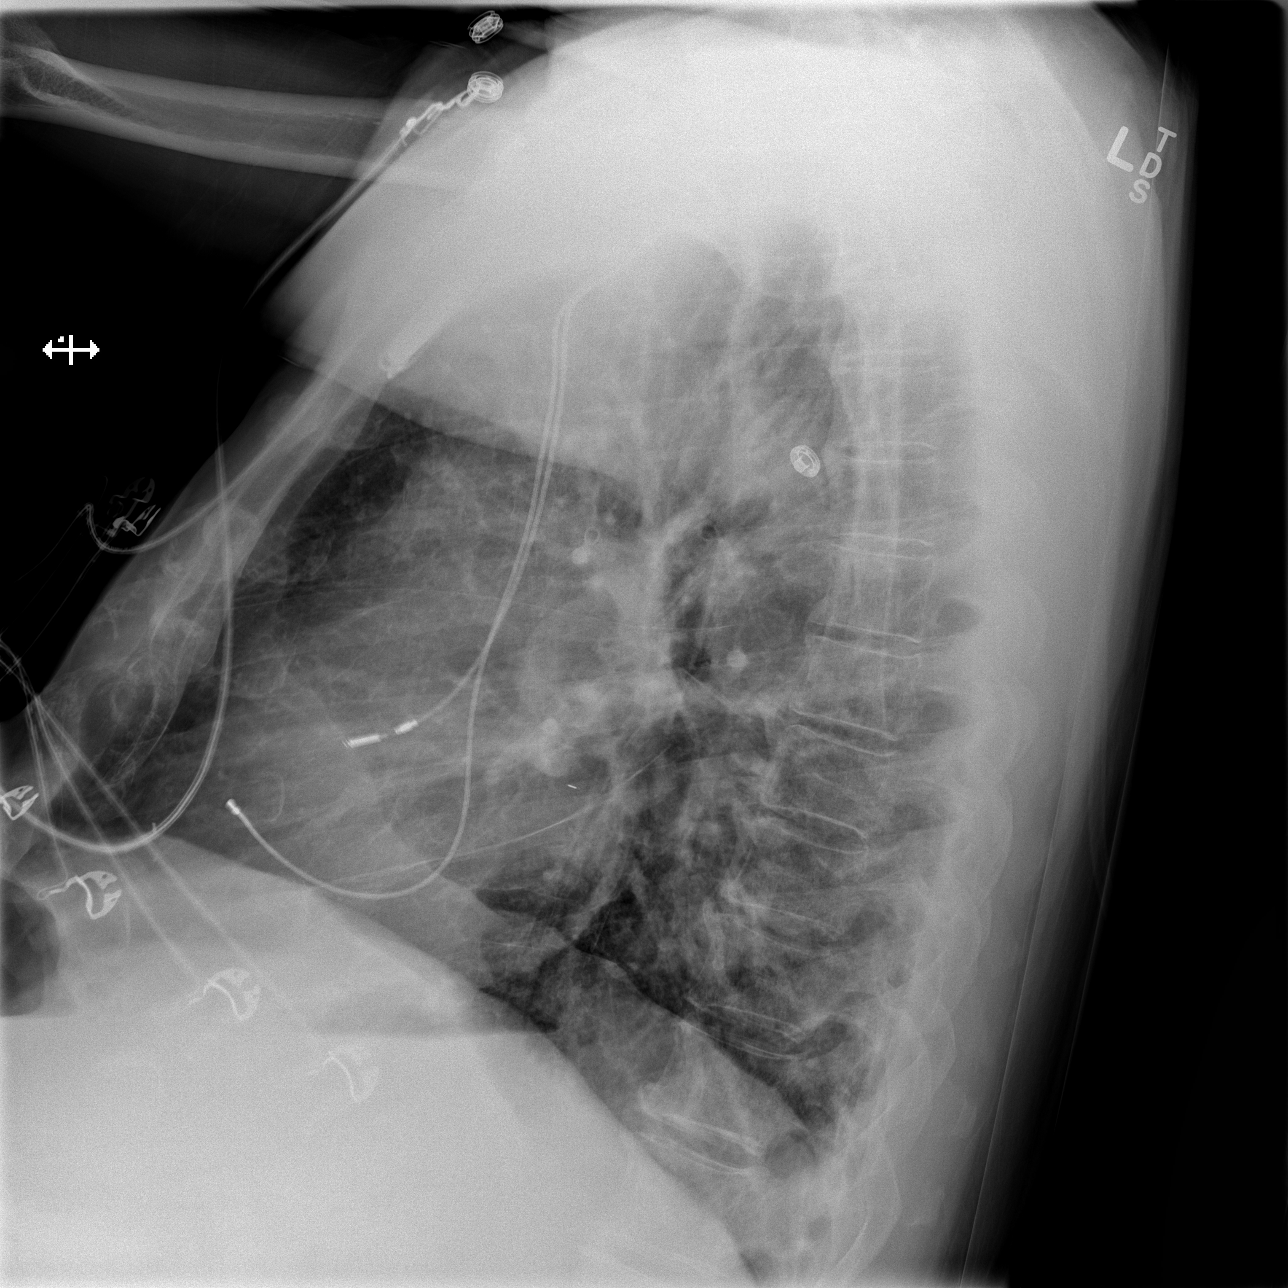

[3 of 3 positions shown; findings below may reference images not displayed]

FINDINGS: Cardiomediastinal silhouette unremarkable, unchanged.
Left subclavian dual lead transvenous pacemaker unchanged and
appears intact.  Scarring in the lower lobes, unchanged.  Lungs
otherwise clear.  Stable mild hyperinflation.  No visible pleural
effusions.  Degenerative changes involving the thoracic spine.  No
significant interval change.
IMPRESSION: Stable mild hyperinflation consistent with COPD and/or asthma.
Stable scarring in the lower lobes.  No acute cardiopulmonary
disease.

## 2014-02-11 IMAGING — US US RENAL
1 series · 14 of 25 positions shown · non-contrast
Comparison: None.

CLINICAL DATA: Acute renal failure.

RENAL/URINARY TRACT ULTRASOUND COMPLETE

[Series 1: us renal · 0.33mm/px · 14 of 42 slices shown]
[im 1/42]
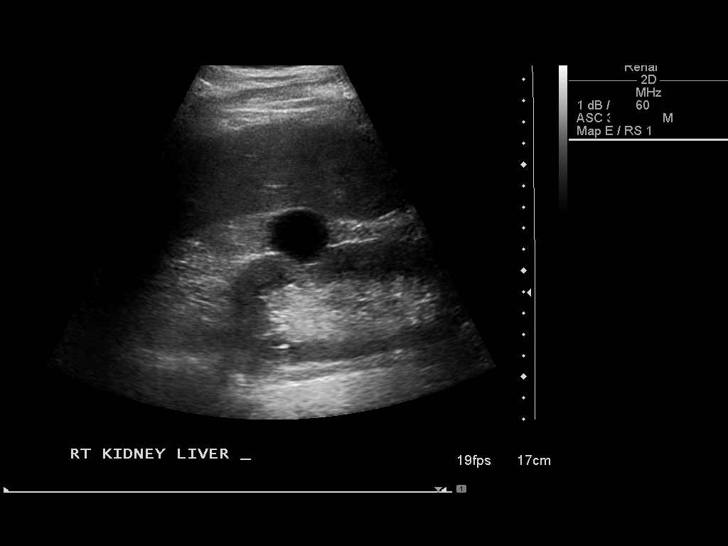
[im 4/42]
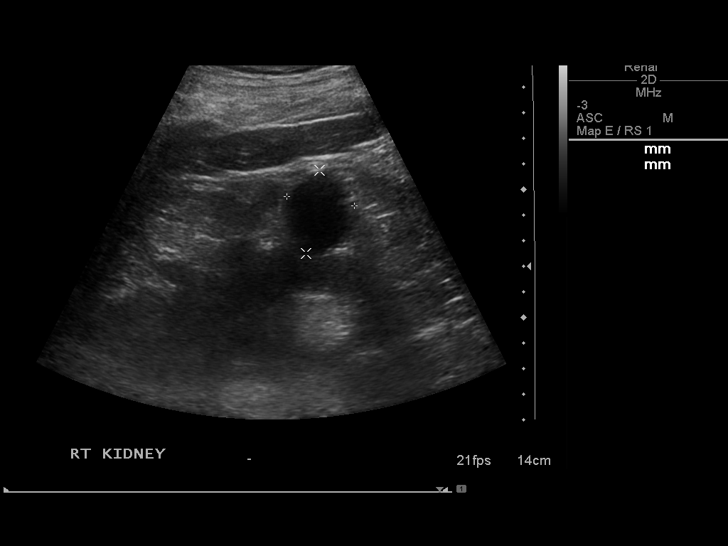
[im 7/42]
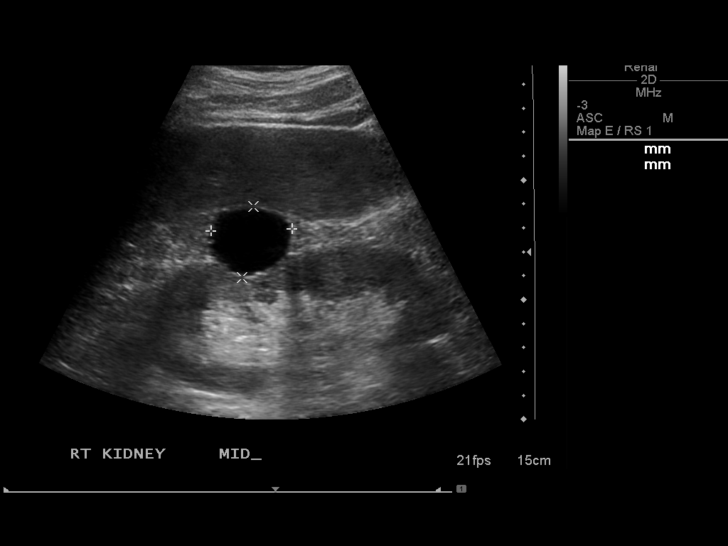
[im 11/42]
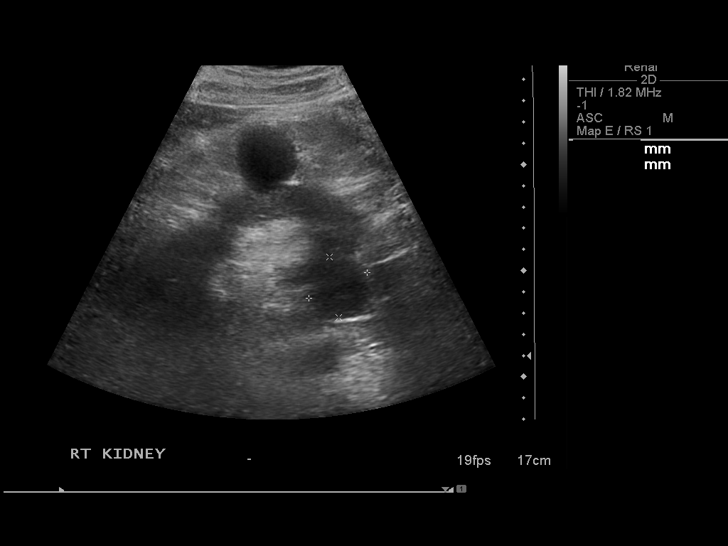
[im 14/42]
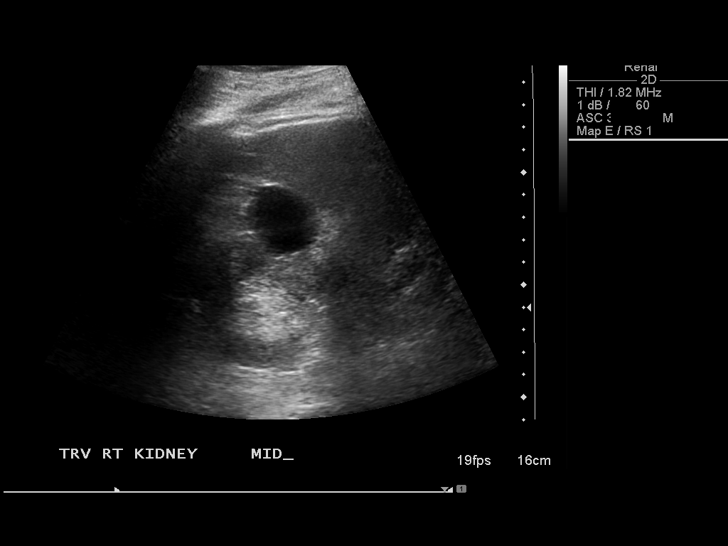
[im 16/42]
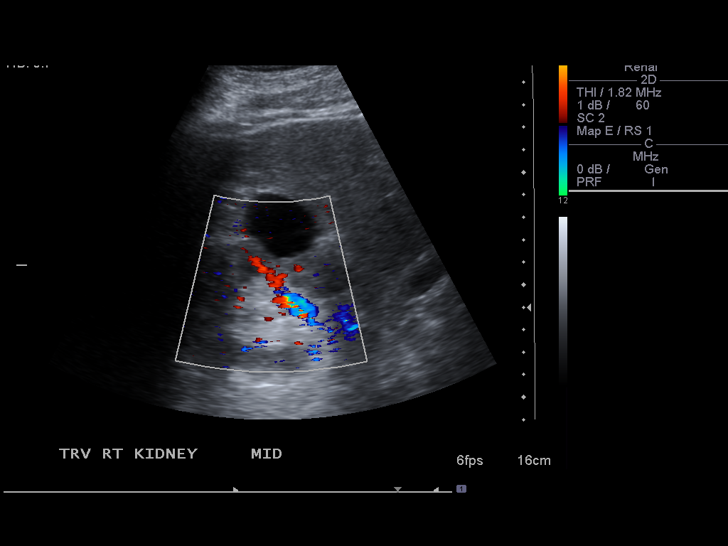
[im 19/42]
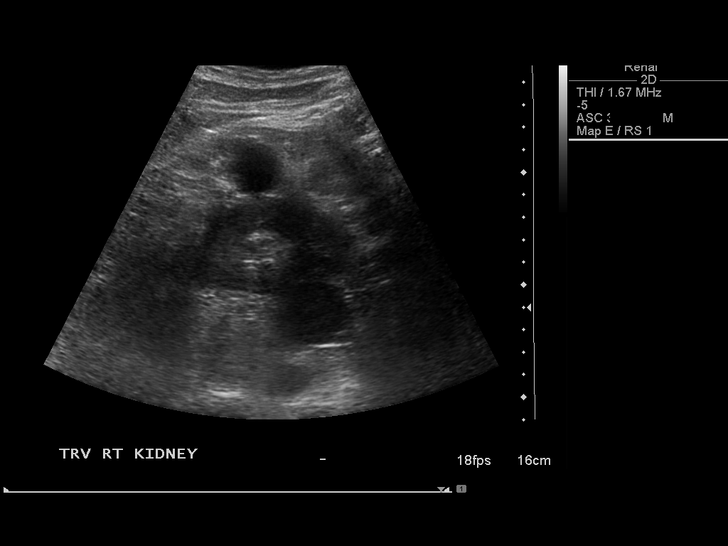
[im 23/42]
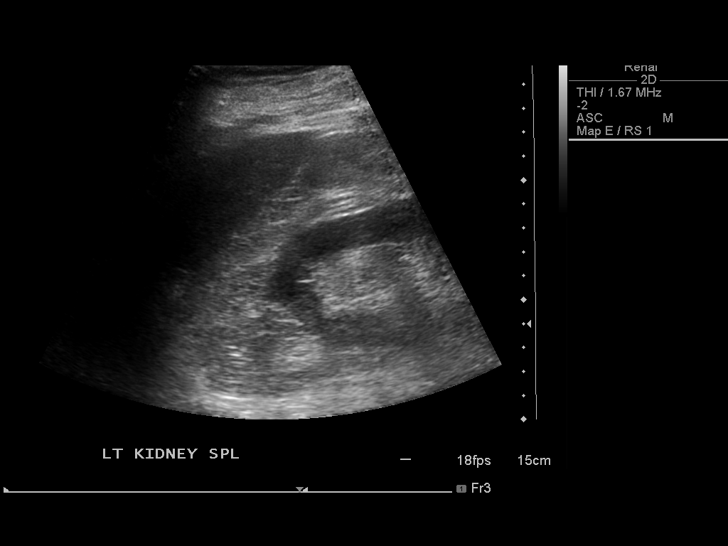
[im 26/42]
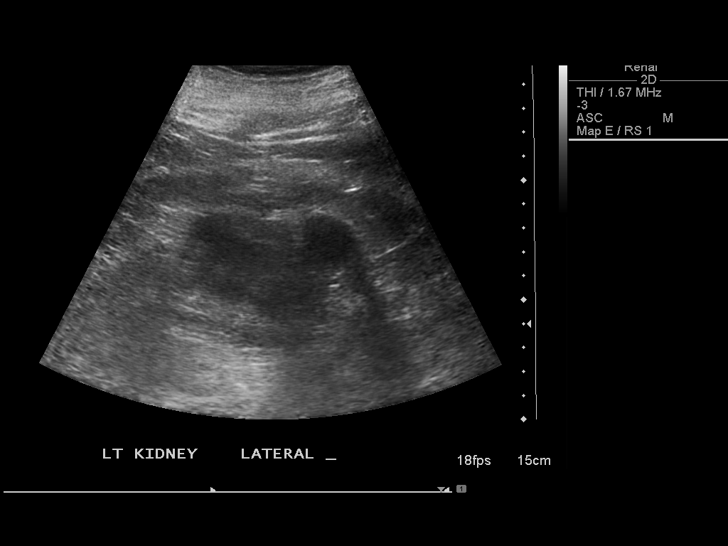
[im 28/42]
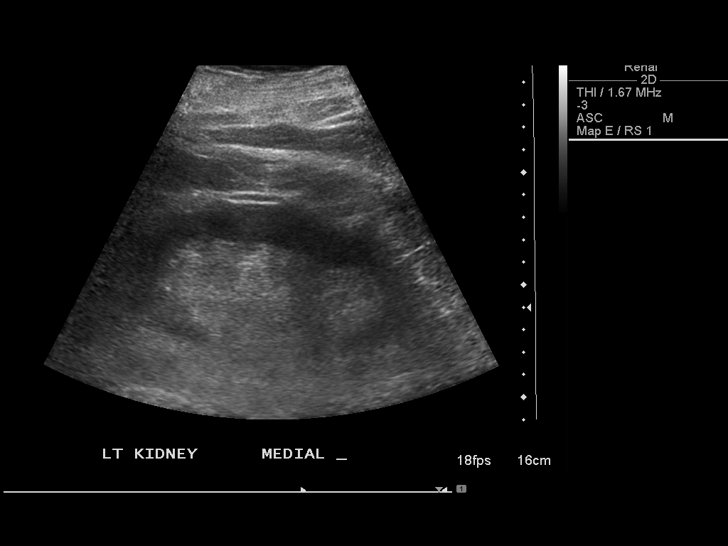
[im 31/42]
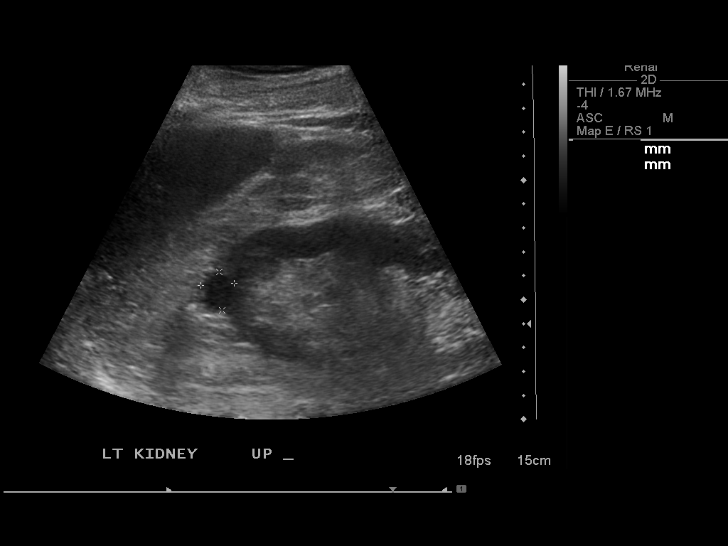
[im 35/42]
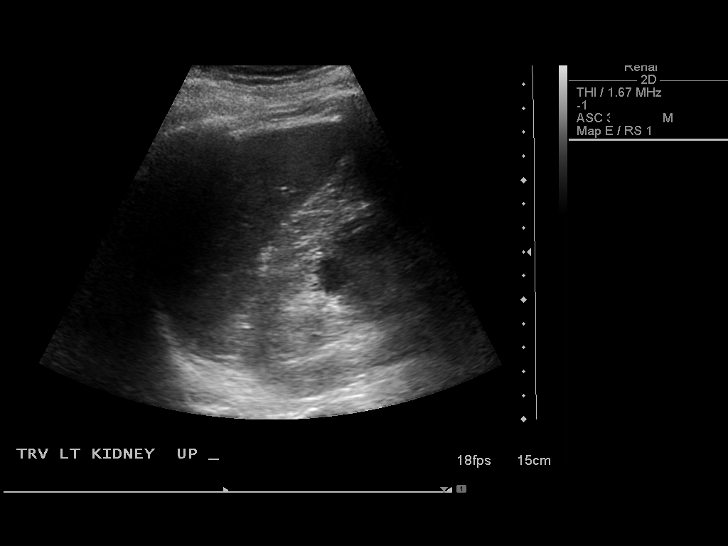
[im 38/42]
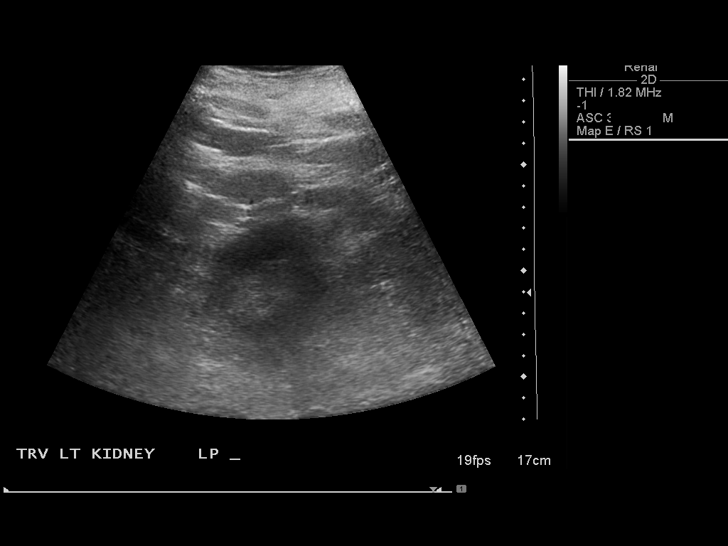
[im 42/42]
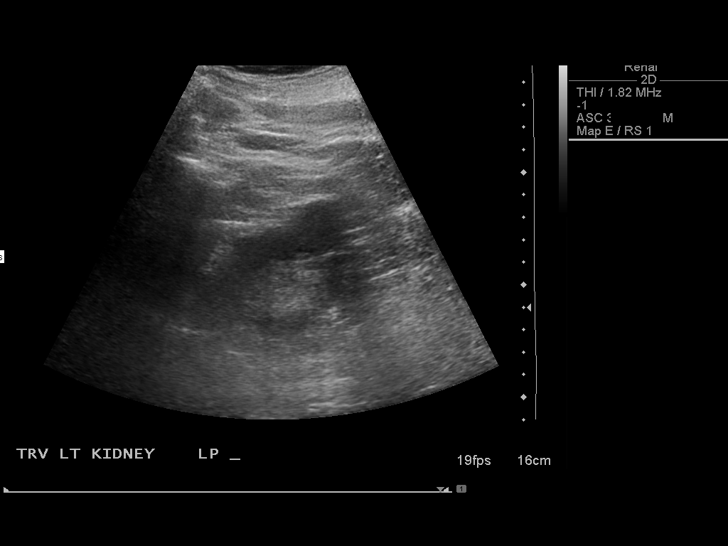

[14 of 25 positions shown; findings below may reference images not displayed]

FINDINGS: Right Kidney:  14.0 cm.  No hydronephrosis.  Three right renal
cysts largest measuring up to 3.4 cm.

Left Kidney:  13.8 cm.  No hydronephrosis. 1.4 cm left upper pole
cyst.  The lower pole 2.3 cm rounded structure may be a slightly
complex cyst but cannot be confirmed as a simple cyst.

Bladder:  Foley catheter in place and decompressed.
IMPRESSION: No evidence hydronephrosis.

Bilateral renal cysts.

Left lower pole 2.3 cm structure may represent a slightly complex
cyst.  This cannot be confirmed as a simple cyst on the present
exam.

## 2014-02-13 IMAGING — CR DG CHEST 1V PORT
1 series · 1 of 1 positions shown · non-contrast
Comparison: Chest radiograph [DATE]

CLINICAL DATA: Fever

PORTABLE CHEST - 1 VIEW

[AP]
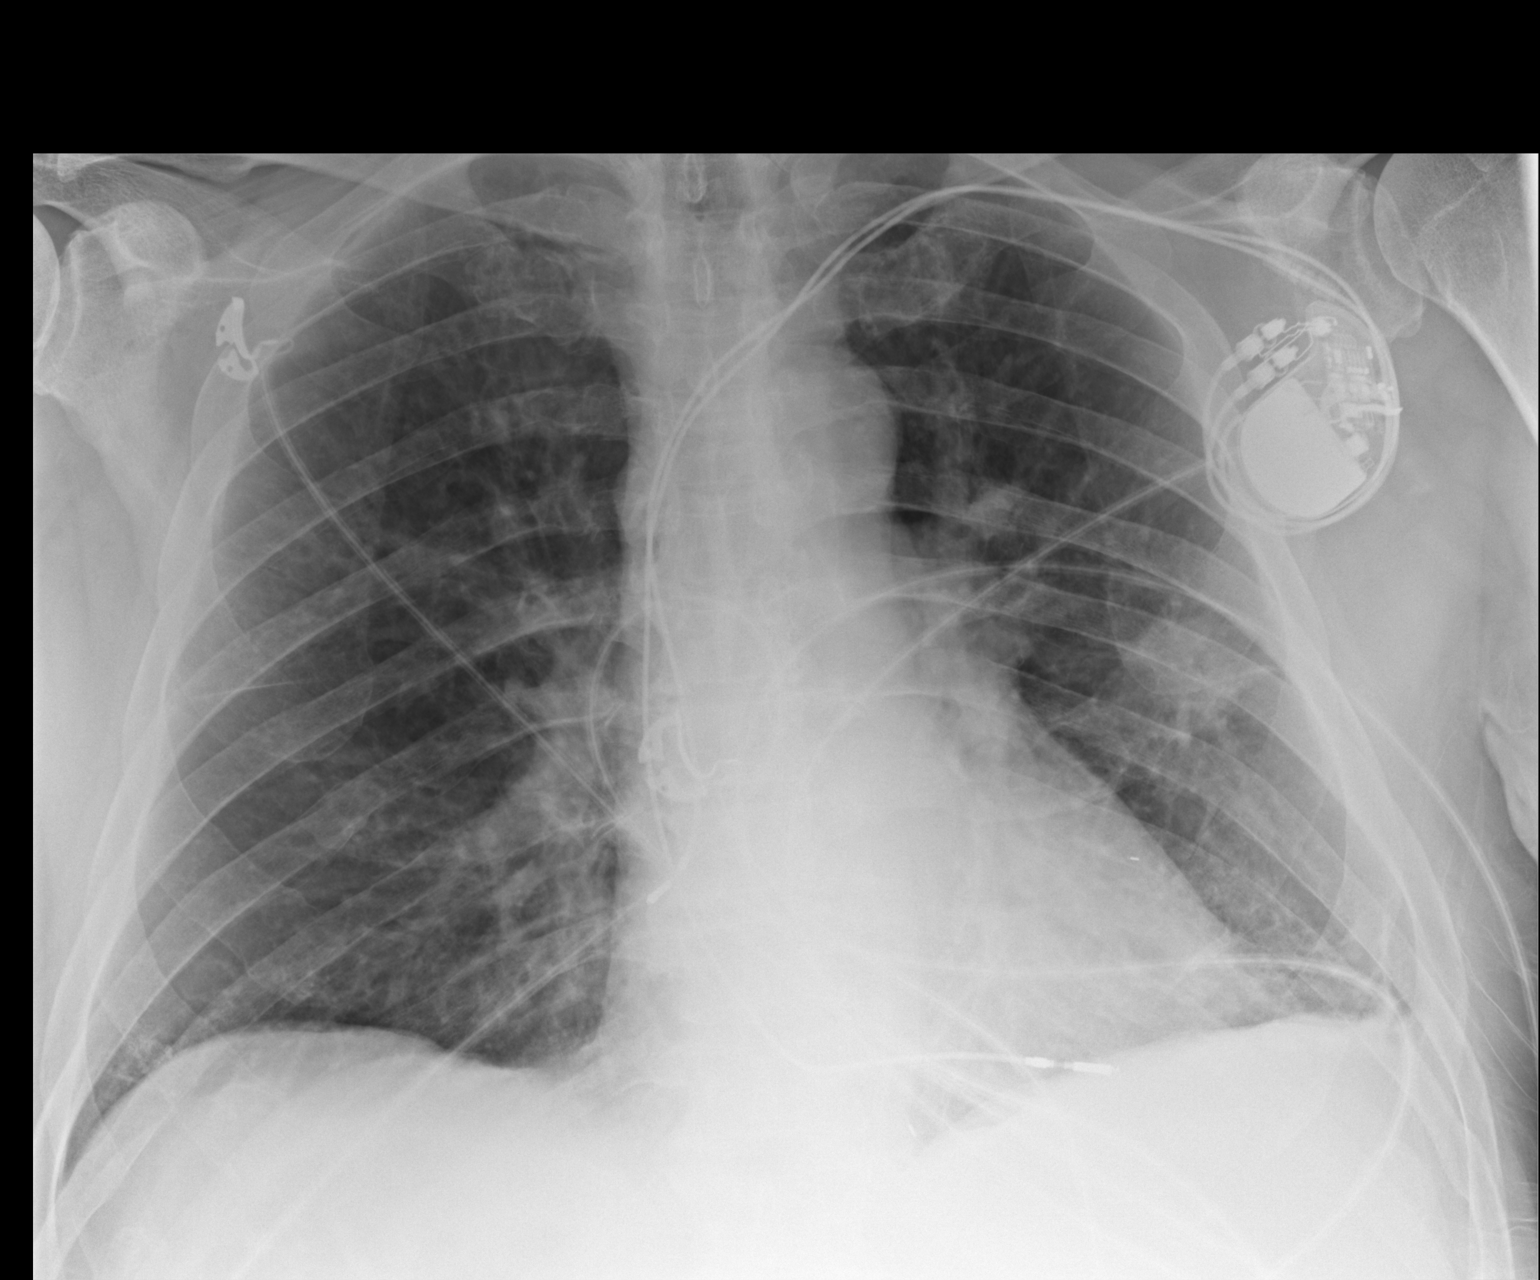

[1 of 1 positions shown; findings below may reference images not displayed]

FINDINGS: Left-sided pacemaker overlies stable cardiac silhouette.
There is an ill-defined 2.5 cm opacity in the left mid lung.  There
is a trace right pleural effusion.  No pulmonary edema.  No
pneumothorax.  No aggressive osseous lesions.
IMPRESSION: Left lung opacity is incompletely characterized.  This could
represent a focus of neoplasm or infection.    Recommend CT thorax
with contrast for further evaluation.

This was made a call report.

## 2014-02-15 ENCOUNTER — Encounter (HOSPITAL_COMMUNITY): Payer: Self-pay | Admitting: Internal Medicine

## 2014-05-25 ENCOUNTER — Ambulatory Visit: Payer: Self-pay | Admitting: Internal Medicine
